# Patient Record
Sex: Female | Born: 1935 | Race: White | Hispanic: No | Smoking: Never smoker
Health system: Southern US, Community
[De-identification: ages and names within clinical notes are randomized; demographics above are authoritative.]

## PROBLEM LIST (undated history)

## (undated) DIAGNOSIS — D649 Anemia, unspecified: Secondary | ICD-10-CM

## (undated) DIAGNOSIS — E039 Hypothyroidism, unspecified: Secondary | ICD-10-CM

## (undated) DIAGNOSIS — E785 Hyperlipidemia, unspecified: Secondary | ICD-10-CM

## (undated) DIAGNOSIS — R143 Flatulence: Secondary | ICD-10-CM

## (undated) DIAGNOSIS — I2510 Atherosclerotic heart disease of native coronary artery without angina pectoris: Secondary | ICD-10-CM

## (undated) DIAGNOSIS — K219 Gastro-esophageal reflux disease without esophagitis: Secondary | ICD-10-CM

## (undated) DIAGNOSIS — E538 Deficiency of other specified B group vitamins: Secondary | ICD-10-CM

## (undated) DIAGNOSIS — K5730 Diverticulosis of large intestine without perforation or abscess without bleeding: Secondary | ICD-10-CM

## (undated) DIAGNOSIS — R142 Eructation: Secondary | ICD-10-CM

## (undated) DIAGNOSIS — K2970 Gastritis, unspecified, without bleeding: Secondary | ICD-10-CM

## (undated) DIAGNOSIS — I10 Essential (primary) hypertension: Secondary | ICD-10-CM

## (undated) DIAGNOSIS — K529 Noninfective gastroenteritis and colitis, unspecified: Secondary | ICD-10-CM

## (undated) DIAGNOSIS — R197 Diarrhea, unspecified: Secondary | ICD-10-CM

## (undated) DIAGNOSIS — I739 Peripheral vascular disease, unspecified: Secondary | ICD-10-CM

## (undated) DIAGNOSIS — K9089 Other intestinal malabsorption: Secondary | ICD-10-CM

## (undated) DIAGNOSIS — I82409 Acute embolism and thrombosis of unspecified deep veins of unspecified lower extremity: Secondary | ICD-10-CM

## (undated) DIAGNOSIS — K589 Irritable bowel syndrome without diarrhea: Secondary | ICD-10-CM

## (undated) DIAGNOSIS — K3184 Gastroparesis: Secondary | ICD-10-CM

## (undated) DIAGNOSIS — R141 Gas pain: Secondary | ICD-10-CM

## (undated) DIAGNOSIS — F0390 Unspecified dementia without behavioral disturbance: Secondary | ICD-10-CM

## (undated) DIAGNOSIS — R1310 Dysphagia, unspecified: Secondary | ICD-10-CM

## (undated) DIAGNOSIS — K2990 Gastroduodenitis, unspecified, without bleeding: Secondary | ICD-10-CM

## (undated) HISTORY — DX: Diarrhea, unspecified: R19.7

## (undated) HISTORY — DX: Gastroduodenitis, unspecified, without bleeding: K29.90

## (undated) HISTORY — DX: Flatulence: R14.3

## (undated) HISTORY — DX: Acute embolism and thrombosis of unspecified deep veins of unspecified lower extremity: I82.409

## (undated) HISTORY — PX: CHOLECYSTECTOMY: SHX55

## (undated) HISTORY — DX: Gas pain: R14.1

## (undated) HISTORY — PX: CORONARY ARTERY BYPASS GRAFT: SHX141

## (undated) HISTORY — PX: UPPER GASTROINTESTINAL ENDOSCOPY: SHX188

## (undated) HISTORY — DX: Gastro-esophageal reflux disease without esophagitis: K21.9

## (undated) HISTORY — DX: Atherosclerotic heart disease of native coronary artery without angina pectoris: I25.10

## (undated) HISTORY — DX: Eructation: R14.2

## (undated) HISTORY — DX: Other intestinal malabsorption: K90.89

## (undated) HISTORY — DX: Diverticulosis of large intestine without perforation or abscess without bleeding: K57.30

## (undated) HISTORY — DX: Gastroparesis: K31.84

## (undated) HISTORY — DX: Anemia, unspecified: D64.9

## (undated) HISTORY — DX: Dysphagia, unspecified: R13.10

## (undated) HISTORY — DX: Essential (primary) hypertension: I10

## (undated) HISTORY — DX: Deficiency of other specified B group vitamins: E53.8

## (undated) HISTORY — DX: Noninfective gastroenteritis and colitis, unspecified: K52.9

## (undated) HISTORY — DX: Irritable bowel syndrome, unspecified: K58.9

## (undated) HISTORY — DX: Hyperlipidemia, unspecified: E78.5

## (undated) HISTORY — DX: Unspecified dementia, unspecified severity, without behavioral disturbance, psychotic disturbance, mood disturbance, and anxiety: F03.90

## (undated) HISTORY — DX: Gastritis, unspecified, without bleeding: K29.70

## (undated) HISTORY — DX: Peripheral vascular disease, unspecified: I73.9

## (undated) HISTORY — DX: Hypothyroidism, unspecified: E03.9

---

## 1998-05-02 ENCOUNTER — Encounter: Payer: Self-pay | Admitting: Neurosurgery

## 1998-05-06 ENCOUNTER — Ambulatory Visit: Admission: RE | Admit: 1998-05-06 | Discharge: 1998-05-06 | Payer: Self-pay | Admitting: Neurosurgery

## 1998-12-01 ENCOUNTER — Other Ambulatory Visit: Admission: RE | Admit: 1998-12-01 | Discharge: 1998-12-01 | Payer: Self-pay | Admitting: Gastroenterology

## 1998-12-01 ENCOUNTER — Encounter (INDEPENDENT_AMBULATORY_CARE_PROVIDER_SITE_OTHER): Payer: Self-pay

## 1999-06-17 ENCOUNTER — Ambulatory Visit (HOSPITAL_COMMUNITY): Admission: RE | Admit: 1999-06-17 | Discharge: 1999-06-17 | Payer: Self-pay | Admitting: Gastroenterology

## 1999-08-16 ENCOUNTER — Ambulatory Visit (HOSPITAL_COMMUNITY): Admission: RE | Admit: 1999-08-16 | Discharge: 1999-08-16 | Payer: Self-pay | Admitting: Neurosurgery

## 1999-08-16 ENCOUNTER — Encounter: Payer: Self-pay | Admitting: Neurosurgery

## 1999-09-08 ENCOUNTER — Ambulatory Visit (HOSPITAL_BASED_OUTPATIENT_CLINIC_OR_DEPARTMENT_OTHER): Admission: RE | Admit: 1999-09-08 | Discharge: 1999-09-08 | Payer: Self-pay | Admitting: Orthopaedic Surgery

## 2001-05-17 ENCOUNTER — Ambulatory Visit (HOSPITAL_COMMUNITY): Admission: RE | Admit: 2001-05-17 | Discharge: 2001-05-17 | Payer: Self-pay | Admitting: Gastroenterology

## 2001-05-17 ENCOUNTER — Encounter (INDEPENDENT_AMBULATORY_CARE_PROVIDER_SITE_OTHER): Payer: Self-pay | Admitting: Specialist

## 2002-05-22 ENCOUNTER — Ambulatory Visit (HOSPITAL_COMMUNITY): Admission: RE | Admit: 2002-05-22 | Discharge: 2002-05-22 | Payer: Self-pay | Admitting: Neurosurgery

## 2002-05-22 ENCOUNTER — Encounter: Payer: Self-pay | Admitting: Neurosurgery

## 2002-06-20 ENCOUNTER — Inpatient Hospital Stay (HOSPITAL_COMMUNITY): Admission: RE | Admit: 2002-06-20 | Discharge: 2002-06-21 | Payer: Self-pay

## 2002-06-20 ENCOUNTER — Encounter (INDEPENDENT_AMBULATORY_CARE_PROVIDER_SITE_OTHER): Payer: Self-pay | Admitting: Specialist

## 2003-07-22 ENCOUNTER — Inpatient Hospital Stay (HOSPITAL_COMMUNITY): Admission: RE | Admit: 2003-07-22 | Discharge: 2003-07-25 | Payer: Self-pay | Admitting: Orthopedic Surgery

## 2003-07-25 ENCOUNTER — Inpatient Hospital Stay (HOSPITAL_COMMUNITY)
Admission: RE | Admit: 2003-07-25 | Discharge: 2003-08-02 | Payer: Self-pay | Admitting: Physical Medicine & Rehabilitation

## 2003-08-29 ENCOUNTER — Ambulatory Visit (HOSPITAL_COMMUNITY): Admission: RE | Admit: 2003-08-29 | Discharge: 2003-08-29 | Payer: Self-pay | Admitting: Orthopedic Surgery

## 2003-10-29 ENCOUNTER — Inpatient Hospital Stay (HOSPITAL_COMMUNITY): Admission: AD | Admit: 2003-10-29 | Discharge: 2003-10-31 | Payer: Self-pay | Admitting: Cardiology

## 2004-02-21 ENCOUNTER — Ambulatory Visit: Payer: Self-pay | Admitting: Gastroenterology

## 2004-02-27 ENCOUNTER — Ambulatory Visit (HOSPITAL_COMMUNITY): Admission: RE | Admit: 2004-02-27 | Discharge: 2004-02-27 | Payer: Self-pay | Admitting: Gastroenterology

## 2004-03-24 ENCOUNTER — Ambulatory Visit: Payer: Self-pay | Admitting: Gastroenterology

## 2004-05-12 ENCOUNTER — Ambulatory Visit: Payer: Self-pay | Admitting: Gastroenterology

## 2004-05-12 ENCOUNTER — Ambulatory Visit (HOSPITAL_COMMUNITY): Admission: RE | Admit: 2004-05-12 | Discharge: 2004-05-12 | Payer: Self-pay | Admitting: Gastroenterology

## 2004-07-09 ENCOUNTER — Ambulatory Visit: Payer: Self-pay | Admitting: Gastroenterology

## 2004-07-18 HISTORY — PX: NISSEN FUNDOPLICATION: SHX2091

## 2004-08-05 ENCOUNTER — Inpatient Hospital Stay (HOSPITAL_COMMUNITY): Admission: RE | Admit: 2004-08-05 | Discharge: 2004-08-10 | Payer: Self-pay | Admitting: General Surgery

## 2004-09-08 ENCOUNTER — Ambulatory Visit: Payer: Self-pay | Admitting: Gastroenterology

## 2004-09-17 ENCOUNTER — Encounter: Admission: RE | Admit: 2004-09-17 | Discharge: 2004-09-17 | Payer: Self-pay | Admitting: General Surgery

## 2004-09-22 ENCOUNTER — Ambulatory Visit: Payer: Self-pay | Admitting: Gastroenterology

## 2004-10-06 ENCOUNTER — Ambulatory Visit: Payer: Self-pay | Admitting: Gastroenterology

## 2004-10-14 ENCOUNTER — Ambulatory Visit: Payer: Self-pay | Admitting: Gastroenterology

## 2004-10-28 ENCOUNTER — Ambulatory Visit: Payer: Self-pay | Admitting: Gastroenterology

## 2004-10-28 ENCOUNTER — Encounter (INDEPENDENT_AMBULATORY_CARE_PROVIDER_SITE_OTHER): Payer: Self-pay | Admitting: *Deleted

## 2004-10-30 ENCOUNTER — Encounter: Admission: RE | Admit: 2004-10-30 | Discharge: 2004-10-30 | Payer: Self-pay | Admitting: General Surgery

## 2004-11-05 ENCOUNTER — Encounter: Admission: RE | Admit: 2004-11-05 | Discharge: 2004-11-05 | Payer: Self-pay | Admitting: General Surgery

## 2006-03-15 ENCOUNTER — Ambulatory Visit: Payer: Self-pay | Admitting: Gastroenterology

## 2006-03-21 ENCOUNTER — Ambulatory Visit: Payer: Self-pay | Admitting: Gastroenterology

## 2006-03-23 ENCOUNTER — Ambulatory Visit: Payer: Self-pay | Admitting: Gastroenterology

## 2006-03-25 ENCOUNTER — Ambulatory Visit (HOSPITAL_COMMUNITY): Admission: RE | Admit: 2006-03-25 | Discharge: 2006-03-25 | Payer: Self-pay | Admitting: Gastroenterology

## 2006-04-05 ENCOUNTER — Ambulatory Visit: Payer: Self-pay | Admitting: Gastroenterology

## 2006-06-30 ENCOUNTER — Ambulatory Visit (HOSPITAL_COMMUNITY): Admission: RE | Admit: 2006-06-30 | Discharge: 2006-07-01 | Payer: Self-pay | Admitting: General Surgery

## 2007-02-08 ENCOUNTER — Ambulatory Visit: Payer: Self-pay | Admitting: Gastroenterology

## 2007-04-06 ENCOUNTER — Encounter: Admission: RE | Admit: 2007-04-06 | Discharge: 2007-04-06 | Payer: Self-pay | Admitting: Neurosurgery

## 2007-06-09 ENCOUNTER — Ambulatory Visit: Payer: Self-pay | Admitting: Gastroenterology

## 2007-06-09 LAB — CONVERTED CEMR LAB
Albumin: 3 g/dL — ABNORMAL LOW (ref 3.5–5.2)
Alkaline Phosphatase: 108 units/L (ref 39–117)
Basophils Absolute: 0 10*3/uL (ref 0.0–0.1)
Basophils Relative: 0.4 % (ref 0.0–1.0)
CO2: 32 meq/L (ref 19–32)
Calcium: 8.7 mg/dL (ref 8.4–10.5)
Chloride: 108 meq/L (ref 96–112)
Creatinine, Ser: 0.7 mg/dL (ref 0.4–1.2)
Eosinophils Absolute: 0.4 10*3/uL (ref 0.0–0.6)
Eosinophils Relative: 8.3 % — ABNORMAL HIGH (ref 0.0–5.0)
Glucose, Bld: 113 mg/dL — ABNORMAL HIGH (ref 70–99)
HCT: 32.7 % — ABNORMAL LOW (ref 36.0–46.0)
Hemoglobin: 10.6 g/dL — ABNORMAL LOW (ref 12.0–15.0)
MCV: 86.3 fL (ref 78.0–100.0)
Neutro Abs: 2.4 10*3/uL (ref 1.4–7.7)
Potassium: 3.8 meq/L (ref 3.5–5.1)
RDW: 14.2 % (ref 11.5–14.6)
Sodium: 145 meq/L (ref 135–145)
Total Bilirubin: 0.8 mg/dL (ref 0.3–1.2)
WBC: 4.5 10*3/uL (ref 4.5–10.5)

## 2007-06-16 ENCOUNTER — Ambulatory Visit: Payer: Self-pay | Admitting: Gastroenterology

## 2007-06-21 ENCOUNTER — Encounter: Payer: Self-pay | Admitting: Gastroenterology

## 2007-07-04 ENCOUNTER — Ambulatory Visit: Payer: Self-pay | Admitting: Gastroenterology

## 2007-07-04 LAB — CONVERTED CEMR LAB
Basophils Absolute: 0 10*3/uL (ref 0.0–0.1)
Eosinophils Absolute: 0.6 10*3/uL (ref 0.0–0.6)
Eosinophils Relative: 10.9 % — ABNORMAL HIGH (ref 0.0–5.0)
Folate: 13.4 ng/mL
HCT: 34.3 % — ABNORMAL LOW (ref 36.0–46.0)
Hemoglobin: 11.1 g/dL — ABNORMAL LOW (ref 12.0–15.0)
Lymphocytes Relative: 31.5 % (ref 12.0–46.0)
MCHC: 32.3 g/dL (ref 30.0–36.0)
MCV: 86 fL (ref 78.0–100.0)
Monocytes Absolute: 0.4 10*3/uL (ref 0.2–0.7)
Saturation Ratios: 12.2 % — ABNORMAL LOW (ref 20.0–50.0)
TSH: 1.01 microintl units/mL (ref 0.35–5.50)
Transferrin: 274.3 mg/dL (ref >=212.0)
Vitamin B-12: 316 pg/mL (ref 211–911)
WBC: 5.1 10*3/uL (ref 4.5–10.5)

## 2007-07-05 ENCOUNTER — Ambulatory Visit (HOSPITAL_COMMUNITY): Admission: RE | Admit: 2007-07-05 | Discharge: 2007-07-05 | Payer: Self-pay | Admitting: Gastroenterology

## 2007-07-10 ENCOUNTER — Encounter: Payer: Self-pay | Admitting: Gastroenterology

## 2007-07-20 ENCOUNTER — Ambulatory Visit: Payer: Self-pay | Admitting: Gastroenterology

## 2007-07-26 ENCOUNTER — Encounter: Payer: Self-pay | Admitting: Gastroenterology

## 2007-07-26 ENCOUNTER — Ambulatory Visit: Payer: Self-pay | Admitting: Gastroenterology

## 2007-08-17 ENCOUNTER — Ambulatory Visit: Payer: Self-pay | Admitting: Gastroenterology

## 2007-08-17 DIAGNOSIS — R197 Diarrhea, unspecified: Secondary | ICD-10-CM | POA: Insufficient documentation

## 2007-09-12 ENCOUNTER — Telehealth: Payer: Self-pay | Admitting: Gastroenterology

## 2007-10-16 DIAGNOSIS — K219 Gastro-esophageal reflux disease without esophagitis: Secondary | ICD-10-CM

## 2007-10-16 DIAGNOSIS — I739 Peripheral vascular disease, unspecified: Secondary | ICD-10-CM

## 2007-10-16 DIAGNOSIS — I10 Essential (primary) hypertension: Secondary | ICD-10-CM | POA: Insufficient documentation

## 2007-10-16 DIAGNOSIS — K5730 Diverticulosis of large intestine without perforation or abscess without bleeding: Secondary | ICD-10-CM | POA: Insufficient documentation

## 2007-10-16 DIAGNOSIS — I2510 Atherosclerotic heart disease of native coronary artery without angina pectoris: Secondary | ICD-10-CM | POA: Insufficient documentation

## 2007-10-16 DIAGNOSIS — E785 Hyperlipidemia, unspecified: Secondary | ICD-10-CM

## 2007-10-16 DIAGNOSIS — I82409 Acute embolism and thrombosis of unspecified deep veins of unspecified lower extremity: Secondary | ICD-10-CM | POA: Insufficient documentation

## 2007-10-16 DIAGNOSIS — E039 Hypothyroidism, unspecified: Secondary | ICD-10-CM | POA: Insufficient documentation

## 2007-10-24 ENCOUNTER — Ambulatory Visit: Payer: Self-pay | Admitting: Gastroenterology

## 2007-12-05 ENCOUNTER — Ambulatory Visit: Payer: Self-pay | Admitting: Gastroenterology

## 2007-12-05 ENCOUNTER — Telehealth: Payer: Self-pay | Admitting: Gastroenterology

## 2007-12-13 ENCOUNTER — Telehealth: Payer: Self-pay | Admitting: Gastroenterology

## 2008-05-09 ENCOUNTER — Ambulatory Visit: Payer: Self-pay | Admitting: Gastroenterology

## 2008-05-09 DIAGNOSIS — R142 Eructation: Secondary | ICD-10-CM

## 2008-05-09 DIAGNOSIS — R1310 Dysphagia, unspecified: Secondary | ICD-10-CM | POA: Insufficient documentation

## 2008-05-09 DIAGNOSIS — R141 Gas pain: Secondary | ICD-10-CM

## 2008-05-09 DIAGNOSIS — R143 Flatulence: Secondary | ICD-10-CM

## 2008-06-05 ENCOUNTER — Encounter: Payer: Self-pay | Admitting: Gastroenterology

## 2008-06-05 ENCOUNTER — Ambulatory Visit: Payer: Self-pay | Admitting: Gastroenterology

## 2008-06-05 DIAGNOSIS — K2990 Gastroduodenitis, unspecified, without bleeding: Secondary | ICD-10-CM

## 2008-06-05 DIAGNOSIS — K2970 Gastritis, unspecified, without bleeding: Secondary | ICD-10-CM | POA: Insufficient documentation

## 2008-06-07 ENCOUNTER — Encounter: Payer: Self-pay | Admitting: Gastroenterology

## 2008-06-24 DIAGNOSIS — K3184 Gastroparesis: Secondary | ICD-10-CM | POA: Insufficient documentation

## 2008-06-25 ENCOUNTER — Ambulatory Visit: Payer: Self-pay | Admitting: Gastroenterology

## 2008-06-25 DIAGNOSIS — K5289 Other specified noninfective gastroenteritis and colitis: Secondary | ICD-10-CM

## 2008-06-25 LAB — CONVERTED CEMR LAB
AST: 25 units/L (ref 0–37)
Albumin: 3.2 g/dL — ABNORMAL LOW (ref 3.5–5.2)
Alkaline Phosphatase: 92 units/L (ref 39–117)
Basophils Absolute: 0 10*3/uL (ref 0.0–0.1)
Basophils Relative: 0.1 % (ref 0.0–3.0)
CO2: 31 meq/L (ref 19–32)
Eosinophils Absolute: 0.2 10*3/uL (ref 0.0–0.7)
Eosinophils Relative: 4.5 % (ref 0.0–5.0)
GFR calc Af Amer: 122 mL/min
GFR calc non Af Amer: 101 mL/min
HCT: 32.6 % — ABNORMAL LOW (ref 36.0–46.0)
Hemoglobin: 10.9 g/dL — ABNORMAL LOW (ref 12.0–15.0)
Iron: 51 ug/dL (ref 42–145)
Neutrophils Relative %: 59.3 % (ref 43.0–77.0)
Platelets: 156 10*3/uL (ref 150–400)
RBC: 3.59 M/uL — ABNORMAL LOW (ref 3.87–5.11)
RDW: 13.6 % (ref 11.5–14.6)
Sed Rate: 36 mm/hr — ABNORMAL HIGH (ref 0–22)
Sodium: 145 meq/L (ref 135–145)
Total Protein: 5.9 g/dL — ABNORMAL LOW (ref 6.0–8.3)
Transferrin: 268.5 mg/dL (ref 212.0–360.0)
Vitamin B-12: 149 pg/mL — ABNORMAL LOW (ref 211–911)

## 2008-06-28 ENCOUNTER — Encounter: Payer: Self-pay | Admitting: Gastroenterology

## 2008-07-01 ENCOUNTER — Ambulatory Visit: Payer: Self-pay | Admitting: Gastroenterology

## 2008-07-05 ENCOUNTER — Telehealth: Payer: Self-pay | Admitting: Gastroenterology

## 2008-07-08 ENCOUNTER — Ambulatory Visit: Payer: Self-pay | Admitting: Gastroenterology

## 2008-07-08 DIAGNOSIS — E538 Deficiency of other specified B group vitamins: Secondary | ICD-10-CM

## 2008-07-15 ENCOUNTER — Ambulatory Visit: Payer: Self-pay | Admitting: Gastroenterology

## 2008-08-12 ENCOUNTER — Ambulatory Visit: Payer: Self-pay | Admitting: Gastroenterology

## 2008-09-10 ENCOUNTER — Ambulatory Visit: Payer: Self-pay | Admitting: Gastroenterology

## 2008-10-10 ENCOUNTER — Ambulatory Visit: Payer: Self-pay | Admitting: Gastroenterology

## 2008-11-15 ENCOUNTER — Ambulatory Visit: Payer: Self-pay | Admitting: Gastroenterology

## 2008-12-11 ENCOUNTER — Ambulatory Visit: Payer: Self-pay | Admitting: Gastroenterology

## 2009-01-24 ENCOUNTER — Ambulatory Visit: Payer: Self-pay | Admitting: Gastroenterology

## 2009-01-24 DIAGNOSIS — K449 Diaphragmatic hernia without obstruction or gangrene: Secondary | ICD-10-CM | POA: Insufficient documentation

## 2009-01-27 ENCOUNTER — Ambulatory Visit (HOSPITAL_COMMUNITY): Admission: RE | Admit: 2009-01-27 | Discharge: 2009-01-27 | Payer: Self-pay | Admitting: Gastroenterology

## 2009-01-28 ENCOUNTER — Encounter: Payer: Self-pay | Admitting: Gastroenterology

## 2009-01-30 ENCOUNTER — Ambulatory Visit: Payer: Self-pay | Admitting: Gastroenterology

## 2009-02-05 ENCOUNTER — Ambulatory Visit: Payer: Self-pay | Admitting: Gastroenterology

## 2009-07-24 ENCOUNTER — Telehealth: Payer: Self-pay | Admitting: Gastroenterology

## 2009-07-28 ENCOUNTER — Encounter: Payer: Self-pay | Admitting: Gastroenterology

## 2009-08-18 ENCOUNTER — Encounter: Payer: Self-pay | Admitting: Gastroenterology

## 2009-09-26 ENCOUNTER — Inpatient Hospital Stay (HOSPITAL_COMMUNITY): Admission: EM | Admit: 2009-09-26 | Discharge: 2009-09-28 | Payer: Self-pay | Admitting: Cardiovascular Disease

## 2009-10-13 ENCOUNTER — Encounter: Payer: Self-pay | Admitting: Gastroenterology

## 2009-10-21 ENCOUNTER — Encounter (INDEPENDENT_AMBULATORY_CARE_PROVIDER_SITE_OTHER): Payer: Self-pay | Admitting: *Deleted

## 2009-10-21 ENCOUNTER — Ambulatory Visit: Payer: Self-pay | Admitting: Gastroenterology

## 2009-10-21 DIAGNOSIS — D649 Anemia, unspecified: Secondary | ICD-10-CM

## 2009-10-21 DIAGNOSIS — R195 Other fecal abnormalities: Secondary | ICD-10-CM

## 2009-10-22 LAB — CONVERTED CEMR LAB
AST: 24 units/L (ref 0–37)
Albumin: 3.9 g/dL (ref 3.5–5.2)
Basophils Relative: 0.7 % (ref 0.0–3.0)
Bilirubin, Direct: 0.1 mg/dL (ref 0.0–0.3)
CO2: 30 meq/L (ref 19–32)
Calcium: 8.9 mg/dL (ref 8.4–10.5)
Creatinine, Ser: 0.7 mg/dL (ref 0.4–1.2)
Ferritin: 25.2 ng/mL (ref 10.0–291.0)
Folate: 7.5 ng/mL
GFR calc non Af Amer: 81.55 mL/min (ref >60)
INR: 0.9 (ref 0.8–1.0)
Lymphocytes Relative: 28.8 % (ref 12.0–46.0)
Lymphs Abs: 1.3 10*3/uL (ref 0.7–4.0)
Monocytes Absolute: 0.3 10*3/uL (ref 0.1–1.0)
Neutrophils Relative %: 53.6 % (ref 43.0–77.0)
Platelets: 183 10*3/uL (ref 150.0–400.0)
Potassium: 4.1 meq/L (ref 3.5–5.1)
Sed Rate: 37 mm/hr — ABNORMAL HIGH (ref 0–22)
Sodium: 144 meq/L (ref 135–145)
Total Protein: 6.7 g/dL (ref 6.0–8.3)
Transferrin: 278.8 mg/dL (ref 212.0–360.0)

## 2009-10-23 ENCOUNTER — Ambulatory Visit: Payer: Self-pay | Admitting: Gastroenterology

## 2009-10-23 ENCOUNTER — Telehealth: Payer: Self-pay | Admitting: Gastroenterology

## 2009-10-28 ENCOUNTER — Ambulatory Visit: Payer: Self-pay | Admitting: Gastroenterology

## 2009-11-13 ENCOUNTER — Encounter (INDEPENDENT_AMBULATORY_CARE_PROVIDER_SITE_OTHER): Payer: Self-pay | Admitting: *Deleted

## 2009-11-13 ENCOUNTER — Ambulatory Visit: Payer: Self-pay | Admitting: Gastroenterology

## 2009-11-25 ENCOUNTER — Ambulatory Visit: Payer: Self-pay | Admitting: Gastroenterology

## 2009-12-01 ENCOUNTER — Encounter: Payer: Self-pay | Admitting: Gastroenterology

## 2009-12-23 ENCOUNTER — Ambulatory Visit: Payer: Self-pay | Admitting: Gastroenterology

## 2010-01-19 ENCOUNTER — Ambulatory Visit: Payer: Self-pay | Admitting: Gastroenterology

## 2010-02-16 ENCOUNTER — Ambulatory Visit: Payer: Self-pay | Admitting: Gastroenterology

## 2010-03-16 ENCOUNTER — Ambulatory Visit: Payer: Self-pay | Admitting: Gastroenterology

## 2010-05-10 ENCOUNTER — Encounter: Payer: Self-pay | Admitting: Gastroenterology

## 2010-05-21 NOTE — Assessment & Plan Note (Signed)
Summary: MONTHLY B12 SHOT...LSW.  Nurse Visit   Vital Signs:  Patient profile:   77 year old female Pulse rate:   72 / minute Pulse rhythm:   regular BP sitting:   190 / 68  (left arm) Cuff size:   regular  Vitals Entered By: Harlow Mares CMA Duncan Dull) (March 16, 2010 11:03 AM) Patient feeling dizzy and she took her meds at 9:30am, I called her cardiologist and spoke to his nurse she states pt is to come straight to their office and see him today. I walked the patient out to the car and explained the situation with her driver.   Allergies: 1)  ! Codeine 2)  ! * Always Use Paper Tape 3)  ! * Latex  Medication Administration  Injection # 1:    Medication: Vit B12 1000 mcg    Diagnosis: B12 DEFICIENCY (ICD-266.2)    Route: IM    Site: L deltoid    Exp Date: 01/2012    Lot #: [id number]    Patient tolerated injection without complications    Given by: Harlow Mares CMA (AAMA) (March 16, 2010 11:06 AM)  Orders Added: 1)  Vit B12 1000 mcg [J3420]

## 2010-05-21 NOTE — Assessment & Plan Note (Signed)
Summary: 3 WEEK F/U..AM.   History of Present Illness Visit Type: Follow-up Visit Primary GI MD: Sheryn Bison MD FACP FAGA Primary Provider: Albertina Senegal, MD  Requesting Provider: n/a Chief Complaint: F/u for gastroparesis, and anemia. Pt states that she is feeling better and denies any GI complaints History of Present Illness:   Her diarrhea has greatly improved off Reglan and with the use of Colestid. Lab data was unremarkable except for B12 deficiency, and we have started replacement therapy.   GI Review of Systems      Denies abdominal pain, acid reflux, belching, bloating, chest pain, dysphagia with liquids, dysphagia with solids, heartburn, loss of appetite, nausea, vomiting, vomiting blood, weight loss, and  weight gain.        Denies anal fissure, black tarry stools, change in bowel habit, constipation, diarrhea, diverticulosis, fecal incontinence, heme positive stool, hemorrhoids, irritable bowel syndrome, jaundice, light color stool, liver problems, rectal bleeding, and  rectal pain.    Current Medications (verified): 1)  Adult Aspirin Ec Low Strength 81 Mg  Tbec (Aspirin) .... One By Mouth Once Daily 2)  Plavix 75 Mg  Tabs (Clopidogrel Bisulfate) .... One By Mouth Once Daily(On Hold) 3)  Toprol Xl 50 Mg  Tb24 (Metoprolol Succinate) .... 1/2  By Mouth Once Daily 4)  Vasotec 20 Mg  Tabs (Enalapril Maleate) .... One By Mouth Once Daily 5)  Amlodipine Besylate 10 Mg Tabs (Amlodipine Besylate) .Marland Kitchen.. 1 By Mouth Once Daily 6)  Synthroid 50 Mcg  Tabs (Levothyroxine Sodium) .... One By Mouth Once Daily 7)  Benefiber   Powd (Wheat Dextrin) .... One Tbsp Every Morning. 8)  Lasix 20 Mg  Tabs (Furosemide) .... One Tablet By Mouth Every Other Day 9)  K-Tabs 10 Meq Cr-Tabs (Potassium Chloride) .... One Tablet By Mouth Once Daily 10)  Lipitor 20 Mg Tabs (Atorvastatin Calcium) .Marland Kitchen.. 1 By Mouth Once Daily 11)  Colestid 1 Gm Tabs (Colestipol Hcl) .... Once Daily 12)  Xanax 0.25 Mg Tabs  (Alprazolam) .Marland Kitchen.. 1 Q Hs As Needed For Sleep 13)  Cyanocobalamin 1000 Mcg/ml Inj Soln (Cyanocobalamin) .Marland Kitchen.. 1 Cc Im Monthly  Allergies (verified): 1)  ! Codeine 2)  ! * Always Use Paper Tape 3)  ! * Latex  Past History:  Past medical, surgical, family and social histories (including risk factors) reviewed for relevance to current acute and chronic problems.  Past Medical History: Reviewed history from 06/24/2008 and no changes required. Current Problems:  GASTROPARESIS (ICD-536.3) GASTRITIS (ICD-535.50) FLATULENCE ERUCTATION AND GAS PAIN (ICD-787.3) DYSPHAGIA UNSPECIFIED (ICD-787.20) PERIPHERAL VASCULAR DISEASE (ICD-443.9) HYPERLIPIDEMIA (ICD-272.4) GERD (ICD-530.81) HYPOTHYROIDISM (ICD-244.9) CAD (ICD-414.00) HYPERTENSION (ICD-401.9) DEEP VENOUS THROMBOPHLEBITIS (ICD-453.40) DIVERTICULOSIS, COLON (ICD-562.10) DIARRHEA (ICD-787.91)  Past Surgical History: Reviewed history from 05/09/2008 and no changes required. NIssan Fundoplication 07-2004 Cholecystectomy Hernia repair  Family History: Reviewed history from 12/05/2007 and no changes required. No FH of Colon Cancer: Family History of Heart Disease: mother and uncle  Social History: Reviewed history from 12/05/2007 and no changes required. Occupation: retired Patient is a former smoker.  Alcohol Use - no Illicit Drug Use - no Patient does not get regular exercise.  sometimes walks her dog  Review of Systems       The patient complains of swelling of feet/legs.  The patient denies allergy/sinus, anemia, anxiety-new, arthritis/joint pain, back pain, blood in urine, breast changes/lumps, change in vision, confusion, cough, coughing up blood, depression-new, fainting, fatigue, fever, headaches-new, hearing problems, heart murmur, heart rhythm changes, itching, menstrual pain, muscle pains/cramps, night sweats, nosebleeds,  pregnancy symptoms, shortness of breath, skin rash, sleeping problems, sore throat, swollen lymph  glands, thirst - excessive , urination - excessive , urination changes/pain, urine leakage, vision changes, and voice change.         chronic dizziness related to her peripheral vascular disease. She is on a multitude of cardiovascular medications and daily Plavix and is followed by Dr. Susa Griffins.  Vital Signs:  Patient profile:   77 year old female Height:      61 inches Weight:      134 pounds BMI:     25.41 BSA:     1.59 Pulse rate:   76 / minute Pulse rhythm:   regular BP sitting:   136 / 80  (left arm) Cuff size:   regular  Vitals Entered By: Ok Anis CMA (November 13, 2009 10:35 AM)  Physical Exam  General:  Well developed, well nourished, no acute distress.healthy appearing.  healthy appearing.   Head:  Normocephalic and atraumatic. Eyes:  PERRLA, no icterus.exam deferred to patient's ophthalmologist.  exam deferred to patient's ophthalmologist.   Psych:  Alert and cooperative. Normal mood and affect.   Impression & Recommendations:  Problem # 1:  FECAL OCCULT BLOOD (ICD-792.1) Assessment Improved CBC was normal and stool cards for heme and hemoglobin were negative. Her anemia is related to B12 deficiency, and we have begun parenteral replacement therapy. I do not think she needs repeat colonoscopy at this time.  Problem # 2:  B12 DEFICIENCY (ICD-266.2) Assessment: Improved  Problem # 3:  DIARRHEA (ICD-787.91) Assessment: Improved Avoidance of Reglan and we'll increase Colestid to 2 g a day as tolerated. Stool exam for fecal elastase-1 has been ordered.  Problem # 4:  PERIPHERAL VASCULAR DISEASE (ICD-443.9) Assessment: Unchanged blood pressure today is 136/80 and pulse is 76 and regular. She is to continue all of her other medications per Dr. Alanda Amass and Dr. Albertina Senegal  Patient Instructions: 1)  Increase colestd to two tabs mid morning. 2)  Please go to the basement for lab instructions. 3)  The medication list was reviewed and reconciled.  All  changed / newly prescribed medications were explained.  A complete medication list was provided to the patient / caregiver. 4)  Please continue current medications.  5)  Copy sent to : Dr. Susa Griffins and Dr. Albertina Senegal.  Appended Document: 3 WEEK F/U..AM.    Clinical Lists Changes  Medications: Changed medication from COLESTID 1 GM TABS (COLESTIPOL HCL) once daily to COLESTID 1 GM TABS (COLESTIPOL HCL) 2 tabs mid morning - Signed Rx of COLESTID 1 GM TABS (COLESTIPOL HCL) 2 tabs mid morning;  #60 x 6;  Signed;  Entered by: Ashok Cordia RN;  Authorized by: Mardella Layman MD [facility];  Method used: Electronically to [facility] [address], [place], [place], [place]  [id number], Ph: [phone], Fax: [phone]    Prescriptions: COLESTID 1 GM TABS (COLESTIPOL HCL) 2 tabs mid morning  #60 x 6   Entered by:   Ashok Cordia RN   Authorized by:   Mardella Layman MD [facility]   Signed by:   Ashok Cordia RN on 11/13/2009   Method used:   Electronically to        [facility] (retail)       [address]       [place], [place]  [address]       Ph: [phone]  Fax: [phone]   RxID:   [id number]

## 2010-05-21 NOTE — Assessment & Plan Note (Signed)
Summary: MONTHLY B12 INJECTION/266.2//SP  Nurse Visit   Allergies: 1)  ! Codeine 2)  ! * Always Use Paper Tape 3)  ! * Latex  Medication Administration  Injection # 1:    Medication: Vit B12 1000 mcg    Diagnosis: B12 DEFICIENCY (ICD-266.2)    Route: IM    Site: R deltoid    Exp Date: 10/2011    Lot #: [id number]    Mfr: [employer]    Comments: pt to schedule next monthly b12 at front desk    Patient tolerated injection without complications    Given by: Chales Abrahams CMA Duncan Dull) (January 19, 2010 10:38 AM)  Orders Added: 1)  Vit B12 1000 mcg [J3420]

## 2010-05-21 NOTE — Letter (Signed)
Summary: [facility]   Imported By: Sherian Rein 08/26/2009 09:22:56  _____________________________________________________________________  External Attachment:    Type:   Image     Comment:   External Document

## 2010-05-21 NOTE — Progress Notes (Signed)
Summary: ? re allergic reaction  Phone Note Call from Patient Call back at Home Phone [phone]   Caller: Patient Call For: Jarold Motto Reason for Call: Talk to Nurse Summary of Call: Patient had an MRI last year and was allergic to something wants to know the name. Initial call taken by: Tawni Levy,  July 24, 2009 1:20 PM  Follow-up for Phone Call        Pt states she had some type of reaction to a dye that she was given at [facility] for some type of xray last year.  She asks what she can do to find out what she was given.  Pt instructed to call [facility] and talk to someone in radiology. Follow-up by: Ashok Cordia RN,  July 24, 2009 2:26 PM

## 2010-05-21 NOTE — Assessment & Plan Note (Signed)
Summary: Monthly B12/dfs  Nurse Visit   Allergies: 1)  ! Codeine 2)  ! * Always Use Paper Tape 3)  ! * Latex  Medication Administration  Injection # 1:    Medication: Vit B12 1000 mcg    Diagnosis: B12 DEFICIENCY (ICD-266.2)    Route: IM    Exp Date: 2/13    Lot #: [id number]    Mfr: [employer]    Patient tolerated injection without complications    Given by: Lamona Curl CMA (AAMA) (October 28, 2009 10:55 AM)  Orders Added: 1)  Vit B12 1000 mcg [J3420]

## 2010-05-21 NOTE — Letter (Signed)
Summary: [name]: Immunoassay Fecal Occult Blood (iFOB) Order [facility]  [address] [place], [place] [address]   Phone: [phone]  Fax: [phone]      [facility]: Immunoassay Fecal Occult Blood (iFOB) Order Form   November 13, 2009 MRN: [id number]   MELI FALEY [birth year]   Physicican Name: Jarold Motto  Diagnosis Code: 787.91      Ashok Cordia RN

## 2010-05-21 NOTE — Progress Notes (Signed)
Summary: apeak to nurse  Phone Note Call from Patient Call back at Home Phone [phone]   Caller: Patient Call For: Jarold Motto Reason for Call: Talk to Nurse Summary of Call: Patient would like to speak to nurse regarding her meds Initial call taken by: Tawni Levy,  October 23, 2009 3:16 PM  Follow-up for Phone Call        Pt has questions re colestid.  Answered all of pt's questions. Follow-up by: Ashok Cordia RN,  October 23, 2009 3:23 PM
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
## 2010-05-21 NOTE — Assessment & Plan Note (Signed)
Summary: MONTHLY B12 SHOT...LSW.  Nurse Visit   Medication Administration  Injection # 1:    Medication: Vit B12 1000 mcg    Diagnosis: B12 DEFICIENCY (ICD-266.2)    Route: IM    Site: R deltoid    Exp Date: 07/2011    Lot #: [id number]    Mfr: [employer]    Comments: PT WILL RETURN ON 12/23/09 FOR NEXT INJECTION    Patient tolerated injection without complications    Given by: Francee Piccolo CMA Duncan Dull) (November 25, 2009 10:54 AM)  Orders Added: 1)  Vit B12 1000 mcg [J3420]

## 2010-05-21 NOTE — Assessment & Plan Note (Signed)
Summary: MONTHLY B12/266.2//SP  Nurse Visit   Medication Administration  Injection # 1:    Medication: Vit B12 1000 mcg    Diagnosis: B12 DEFICIENCY (ICD-266.2)    Route: IM    Site: L deltoid    Exp Date: 09/2011    Lot #: [id number]    Mfr: [employer]    Comments: PT WILL RETURN ON 10/3 FOR NEXT INJECTION    Patient tolerated injection without complications    Given by: Francee Piccolo CMA Duncan Dull) (December 23, 2009 10:38 AM)  Orders Added: 1)  Vit B12 1000 mcg [J3420]

## 2010-05-21 NOTE — Assessment & Plan Note (Signed)
Summary: MONTHLY B12 SHOT & FLU SHOT...LSW.  Nurse Visit   Allergies: 1)  ! Codeine 2)  ! * Always Use Paper Tape 3)  ! * Latex  Medication Administration  Injection # 1:    Medication: Vit B12 1000 mcg    Diagnosis: B12 DEFICIENCY (ICD-266.2)    Route: IM    Site: L deltoid    Exp Date: 10/2011    Lot #: [id number]    Mfr: American Regent    Patient tolerated injection without complications    Given by: Harlow Mares CMA (AAMA) (February 16, 2010 10:57 AM)  Orders Added: 1)  Vit B12 1000 mcg [J3420]

## 2010-05-21 NOTE — Letter (Signed)
Summary: [facility]: Immunoassay Fecal Occult Blood (iFOB) Order [facility]  [address] [place], [place] [address]   Phone: [phone]  Fax: [phone]      [facility]: Immunoassay Fecal Occult Blood (iFOB) Order Form   October 21, 2009 MRN: [id number]   [name] [birth year]   Physicican Name: Jarold Motto  Diagnosis Code: 792.1      Ashok Cordia RN

## 2010-05-21 NOTE — Letter (Signed)
Summary: [facility]  [facility]   Imported By: Lennie Odor 10/27/2009 11:47:37  _____________________________________________________________________  External Attachment:    Type:   Image     Comment:   External Document

## 2010-06-17 ENCOUNTER — Telehealth: Payer: Self-pay | Admitting: Gastroenterology

## 2010-06-25 NOTE — Progress Notes (Signed)
Summary: triage  Phone Note Call from Patient Call back at Home Phone [phone]   Caller: Patient Call For: Dr Jarold Motto Reason for Call: Talk to Nurse Summary of Call: Patient would like to see Dr Jarold Motto for diarrhea offered her 1st available 3-9 and she wants something sooner. (please let the phone ring for a while because she's hard of hearing) Initial call taken by: Tawni Levy,  June 17, 2010 12:33 PM  Follow-up for Phone Call        Patient reports diarrhea x 3 days; sometimes up to 8 x in 1 hour. Patient states she can usually take an Imodium and the diarrhea will clear. She denies pain with the diarrhea and she is on no meds; last note listed Colestid 2G daily. She saw Dr Alanda Amass and told him she feels washed-out and he recommeded she begin B12 injections again. Patient would like to talk to Dr Jarold Motto about restarting them. She also asked about her COLON recall- last one 07/27/2007, but no recall in report or on Flowsheet- sent for chart. The only appointment I could offer patient was 06/26/10 and she took it. She will call if diarrhea gets worse or other problems arise. Follow-up by: Graciella Freer RN,  June 17, 2010 2:16 PM  Additional Follow-up for Phone Call Additional follow up Details #1::        Reviewed patient's chart on EMR. ON last visit, 11/13/09, Dr Jarold Motto stated he did not think patient needed a repeat COLON. Additional Follow-up by: Graciella Freer RN,  June 17, 2010 3:24 PM

## 2010-06-26 ENCOUNTER — Encounter: Payer: Self-pay | Admitting: Gastroenterology

## 2010-06-26 ENCOUNTER — Ambulatory Visit (INDEPENDENT_AMBULATORY_CARE_PROVIDER_SITE_OTHER): Payer: Medicare Other | Admitting: Gastroenterology

## 2010-06-26 DIAGNOSIS — R197 Diarrhea, unspecified: Secondary | ICD-10-CM

## 2010-06-30 NOTE — Assessment & Plan Note (Signed)
Summary: Follow up diarrhea   History of Present Illness Visit Type: Follow-up Visit Primary GI MD: Sheryn Bison MD Faith Rogue Primary Provider: Shearon Stalls Requesting Provider: n/a Chief Complaint: Follow up for IBS, wants to discuss B12 shots History of Present Illness:    this elderly lady with multiple cardiovascular issues continues with frequent stooling but no significant abdominal pain or systemic complaints. she is confused and has advancing dementia not really sure which medications she is taking at this time. recent stool exam for fecal-elastase-1  were negative. I have discontinued her metoclopramide. she as an appointment at [facility] for evaluation of her macular degeneration and also associated severe hearing loss. she is followed by cardiology and Dr. Susa Griffins.   GI Review of Systems      Denies abdominal pain, acid reflux, belching, bloating, chest pain, dysphagia with liquids, dysphagia with solids, heartburn, loss of appetite, nausea, vomiting, vomiting blood, weight loss, and  weight gain.      Reports diarrhea and  irritable bowel syndrome.     Denies anal fissure, black tarry stools, change in bowel habit, constipation, diverticulosis, fecal incontinence, heme positive stool, hemorrhoids, jaundice, light color stool, liver problems, rectal bleeding, and  rectal pain.    Current Medications (verified): 1)  Adult Aspirin Ec Low Strength 81 Mg  Tbec (Aspirin) .... One By Mouth Once Daily 2)  Toprol Xl 50 Mg  Tb24 (Metoprolol Succinate) .... 1/2  By Mouth Once Daily 3)  Synthroid 50 Mcg  Tabs (Levothyroxine Sodium) .... One By Mouth Once Daily 4)  Lasix 20 Mg  Tabs (Furosemide) .... One Tablet By Mouth Every Other Day 5)  K-Tabs 10 Meq Cr-Tabs (Potassium Chloride) .... One Tablet By Mouth Once Daily 6)  Lipitor 20 Mg Tabs (Atorvastatin Calcium) .Marland Kitchen.. 1 By Mouth Once Daily 7)  Colestid 1 Gm Tabs (Colestipol Hcl) .... 2 Tabs Mid Morning 8)  Xanax  0.25 Mg Tabs (Alprazolam) .Marland Kitchen.. 1 Q Hs As Needed For Sleep 9)  Cyanocobalamin 1000 Mcg/ml Inj Soln (Cyanocobalamin) .Marland Kitchen.. 1 Cc Im Monthly 10)  Norvasc 10 Mg Tabs (Amlodipine Besylate) .Marland Kitchen.. 1 By Mouth Once Daily 11)  Tekturna 150 Mg Tabs (Aliskiren Fumarate) .Marland Kitchen.. 1 By Mouth Once Daily 12)  Antivert 25 Mg Tabs (Meclizine Hcl) .... Use As Needed 13)  Tramadol Hcl 50 Mg Tabs (Tramadol Hcl) .... As Needed  Allergies (verified): 1)  ! Codeine 2)  ! * Always Use Paper Tape 3)  ! * Latex  Past History:  Family History: Last updated: 12/05/2007 No FH of Colon Cancer: Family History of Heart Disease: mother and uncle  Social History: Last updated: 12/05/2007 Occupation: retired Patient is a former smoker.  Alcohol Use - no Illicit Drug Use - no Patient does not get regular exercise.  sometimes walks her dog  Past medical, surgical, family and social histories (including risk factors) reviewed for relevance to current acute and chronic problems.  Past Medical History: Reviewed history from 06/24/2008 and no changes required. Current Problems:  GASTROPARESIS (ICD-536.3) GASTRITIS (ICD-535.50) FLATULENCE ERUCTATION AND GAS PAIN (ICD-787.3) DYSPHAGIA UNSPECIFIED (ICD-787.20) PERIPHERAL VASCULAR DISEASE (ICD-443.9) HYPERLIPIDEMIA (ICD-272.4) GERD (ICD-530.81) HYPOTHYROIDISM (ICD-244.9) CAD (ICD-414.00) HYPERTENSION (ICD-401.9) DEEP VENOUS THROMBOPHLEBITIS (ICD-453.40) DIVERTICULOSIS, COLON (ICD-562.10) DIARRHEA (ICD-787.91)  Past Surgical History: Reviewed history from 05/09/2008 and no changes required. NIssan Fundoplication 07-2004 Cholecystectomy Hernia repair  Family History: Reviewed history from 12/05/2007 and no changes required. No FH of Colon Cancer: Family History of Heart Disease: mother and uncle  Social History: Reviewed history  from 12/05/2007 and no changes required. Occupation: retired Patient is a former smoker.  Alcohol Use - no Illicit Drug Use -  no Patient does not get regular exercise.  sometimes walks her dog  Vital Signs:  Patient profile:   77 year old female Height:      61 inches Weight:      135 pounds BMI:     25.60 BSA:     1.60 Pulse rate:   80 / minute Pulse rhythm:   regular BP sitting:   152 / 62  (left arm)  Vitals Entered By: Merri Ray CMA Duncan Dull) (June 26, 2010 8:50 AM)  Physical Exam  General:  Well developed, well nourished, no acute distress. this patient is confused and is very hard of hearing making exam difficult. Head:  Normocephalic and atraumatic. Eyes:  PERRLA, no icterus.exam deferred to patient's ophthalmologist.   Psych:  poor concentration, poor memory, and confused.     Impression & Recommendations:  Problem # 1:  FECAL OCCULT BLOOD (ICD-792.1) Assessment Improved  stool cards 4 human hemoglobin were negative.  Problem # 2:  DIARRHEA (ICD-787.91) Assessment: Unchanged  she did not get her Colestid prescription filled. I have read prescribed Colestid 2 g a day we will see how she does symptomatically. If she does not improve we will try Xifaxan 550 mg twice a day. I have added Align  probiotic therapy to her regime. other considerations would be that she has microscopic colitis , and we may need to do flexible sigmoidoscopy with random biopsies. we'll send this note also to her primary care physician her her advance in dementia problems.  Patient Instructions: 1)  Copy sent to : Delton See Pollock,MD  and Dr. Susa Griffins. 2)  Your prescription(s) have been sent to you pharmacy.  3)  Take Align once a day.  4)  The medication list was reviewed and reconciled.  All changed / newly prescribed medications were explained.  A complete medication list was provided to the patient / caregiver. 5)  Please call our GI Office back in 2 weeks with a follow-up of symptoms. Prescriptions: COLESTID 1 GM TABS (COLESTIPOL HCL) 2 tabs mid morning  #60 x 6   Entered by:   Harlow Mares CMA (AAMA)    Authorized by:   Mardella Layman MD [facility]   Signed by:   Harlow Mares CMA (AAMA) on 06/26/2010   Method used:   Electronically to        [facility] (retail)       [address]       [place], [place]  [id number]       Ph: [phone]       Fax: [phone]   RxID:   [id number]

## 2010-07-06 LAB — BASIC METABOLIC PANEL
BUN: 6 mg/dL (ref 6–23)
BUN: 8 mg/dL (ref 6–23)
CO2: 27 mEq/L (ref 19–32)
CO2: 28 mEq/L (ref 19–32)
CO2: 29 mEq/L (ref 19–32)
Calcium: 8.1 mg/dL — ABNORMAL LOW (ref 8.4–10.5)
Calcium: 8.5 mg/dL (ref 8.4–10.5)
Chloride: 105 mEq/L (ref 96–112)
Chloride: 107 mEq/L (ref 96–112)
Chloride: 108 mEq/L (ref 96–112)
Creatinine, Ser: 0.64 mg/dL (ref 0.4–1.2)
GFR calc Af Amer: >60 mL/min (ref >60)
GFR calc Af Amer: >60 mL/min (ref >60)
Glucose, Bld: 104 mg/dL — ABNORMAL HIGH (ref 70–99)
Glucose, Bld: 143 mg/dL — ABNORMAL HIGH (ref 70–99)
Potassium: 3.6 mEq/L (ref 3.5–5.1)
Sodium: 140 mEq/L (ref 135–145)
Sodium: 141 mEq/L (ref 135–145)

## 2010-07-06 LAB — MAGNESIUM: Magnesium: 1.9 mg/dL (ref 1.5–2.5)

## 2010-07-06 LAB — CK TOTAL AND CKMB (NOT AT ARMC)
CK, MB: 3.2 ng/mL (ref 0.3–4.0)
CK, MB: 3.5 ng/mL (ref 0.3–4.0)
CK, MB: 4 ng/mL (ref 0.3–4.0)
Total CK: 328 U/L — ABNORMAL HIGH (ref 7–177)
Total CK: 442 U/L — ABNORMAL HIGH (ref 7–177)

## 2010-07-06 LAB — CBC
HCT: 29.5 % — ABNORMAL LOW (ref 36.0–46.0)
Hemoglobin: 10.1 g/dL — ABNORMAL LOW (ref 12.0–15.0)
Hemoglobin: 9.9 g/dL — ABNORMAL LOW (ref 12.0–15.0)
MCHC: 33.5 g/dL (ref 30.0–36.0)
MCHC: 34 g/dL (ref 30.0–36.0)
MCV: 90.5 fL (ref 78.0–100.0)
MCV: 90.8 fL (ref 78.0–100.0)
Platelets: 177 10*3/uL (ref 150–400)
RBC: 3.29 MIL/uL — ABNORMAL LOW (ref 3.87–5.11)
RDW: 14.4 % (ref 11.5–15.5)
WBC: 4.9 10*3/uL (ref 4.0–10.5)

## 2010-07-06 LAB — HEMOCCULT GUIAC POC 1CARD (OFFICE): Fecal Occult Bld: NEGATIVE

## 2010-07-06 LAB — TROPONIN I
Troponin I: 0.01 ng/mL (ref 0.00–0.06)
Troponin I: <0.01 ng/mL (ref 0.00–0.06)

## 2010-07-06 LAB — URINE CULTURE

## 2010-08-11 ENCOUNTER — Telehealth: Payer: Self-pay | Admitting: Gastroenterology

## 2010-08-11 NOTE — Telephone Encounter (Signed)
LMOM for pt to call back.

## 2010-08-12 ENCOUNTER — Encounter: Payer: Self-pay | Admitting: *Deleted

## 2010-08-12 NOTE — Telephone Encounter (Signed)
Pt with hx of GERD, Gastoparesis, Chronic Anemia with B12 defieciency, Fundoplication and DEMENTIA..... Last OV 06/26/10 f/u diarrhea and pt wanted to resume B12 injections. According to records, pt never completes the full year of B12 injections. OV previously 11/13/09, she completed 5 months. Last B12 lab 10/21/09 reading 240. Today, pt stated she's having trouble with arthritis and feeling weak. Pt will be given a f/u visit, B12's ordered with close attention to her appointments.

## 2010-08-12 NOTE — Telephone Encounter (Signed)
Notified [name] I am sending her a letter with her appt with Dr Jarold Motto on 08/28/10 at 0945am. [name] stated understanding.

## 2010-08-28 ENCOUNTER — Ambulatory Visit (INDEPENDENT_AMBULATORY_CARE_PROVIDER_SITE_OTHER): Payer: Medicare Other | Admitting: Gastroenterology

## 2010-08-28 ENCOUNTER — Encounter: Payer: Self-pay | Admitting: Gastroenterology

## 2010-08-28 VITALS — BP 136/72 | HR 66 | Ht 61.0 in | Wt 137.0 lb

## 2010-08-28 DIAGNOSIS — K589 Irritable bowel syndrome without diarrhea: Secondary | ICD-10-CM

## 2010-08-28 DIAGNOSIS — F0390 Unspecified dementia without behavioral disturbance: Secondary | ICD-10-CM | POA: Insufficient documentation

## 2010-08-28 DIAGNOSIS — E538 Deficiency of other specified B group vitamins: Secondary | ICD-10-CM | POA: Insufficient documentation

## 2010-08-28 DIAGNOSIS — K9089 Other intestinal malabsorption: Secondary | ICD-10-CM

## 2010-08-28 MED ORDER — LACTASE 3000 UNITS PO TABS: 1.0000 | ORAL_TABLET | Freq: Three times a day (TID) | ORAL | Status: DC

## 2010-08-28 MED ORDER — CYANOCOBALAMIN 1000 MCG/ML IJ SOLN
1000.0000 ug | Freq: Once | INTRAMUSCULAR | Status: AC
  Administered 2010-08-28: 1000 ug via INTRAMUSCULAR

## 2010-08-28 NOTE — Patient Instructions (Signed)
Today you got a b12 injection you will not need any more injections. Dr Jarold Motto will send this note to your primary care MD. Take Lactaid tablets with meals, you can buy this medication over the counter.

## 2010-08-28 NOTE — Progress Notes (Signed)
History of Present Illness: This is a 77 year old Caucasian female with bile - salt diarrhea doing well on Colestid 2 g a day. She also has mild chronic dementia and B12 deficiency. She apparently had recent neurologic consultation in [place], [place]. These records are not available for review. She seemed to be doing well and denies significant gastrointestinal complaints except for postprandial nausea related to her numerous medications.   Current Medications, Allergies, Past Medical History, Past Surgical History, Family History and Social History were reviewed in [employer] record.   Assessment and plan: B12 1000 mcg IM today with monthly B12 shots with her primary care physician. We have had extreme difficulty communicating with this patient because of her dementia . She does not want to do is we'll B12 spray. She seemed to have an element of lactose intolerance and I have prescribed when necessary Lactaid tablets with avoidance of sorbitol and fructose and continuing Colestid as tolerated. See her on when necessary basis as needed.   Please copy her primary care physician, referring physician, and pertinent subspecialists.  Encounter Diagnosis  Name Primary?  . Vitamin B12 deficiency Yes

## 2010-09-01 NOTE — Assessment & Plan Note (Signed)
[name]                         GASTROENTEROLOGY OFFICE NOTE   NAME:BOLINGDaysha, [name]                      MRN:          [id number]  DATE:06/09/2007                            DOB:          July 15, 1921    [name] is an elderly white female, who has been a chronic patient of  mine for many years with underlying irritable bowel syndrome and  suspected bacterial overgrowth of her bowel.  She has had multiple  repairs of both hiatal hernia and diaphragmatic hernia by Dr. Derrell Lolling and  also had repair of an inguinal hernia by Dr. Derrell Lolling in March of 2008.   She now complains of epigastric discomfort, lower abdominal discomfort,  nausea, bloating, mustard-colored stools and five to six loose bowel  movements a day.  It is of note that the patient has been treated for  urinary tract infection, which seems to have exacerbated her problems.  She denies bloody diarrhea, fever, chills, anorexia, weight-loss, or  other systemic complaints.   She is on multiple medications, listed and reviewed in her chart, which  include:   1. Lipitor 20 mg a day.  2. Toprol XL 50 mg a day.  3. Aspirin 81 mg a day.  4. Nexium 40 mg twice a day.  5. Plavix 75 mg a day.  6. Caduet 10/80 mg a day.  7. Zetia 10 mg a day.  8. Questran p.r.n. use.   The patient's last endoscopy was in October this past year and she had  intact fundal plication and was dilated empirically with a #60 [place]  dilator.  Her last colonoscopy exam, because of chronic diarrhea and  stated family history of colon polyps, was entirely normal in July of  2006.  She had multiple colon biopsies, excluding microscopic  collagenous colitis.  At that time, she was placed on cholestyramine  therapy because of her chronic diarrhea.   EXAM:  She is awake and alert, in no acute distress, appearing her  stated age.  She really does not appear any different than previous  exams.  She weighs 142 pounds, which is  about her normal weight.  She was  afebrile and blood pressure was 152/64 and pulse was 72 and regular.  I could not appreciate stigmata of chronic liver disease.  Her chest was clear and she was in a regular rhythm without murmurs,  gallops or rubs.  Her abdominal exam showed no distention, organomegaly, masses or  tenderness.  Bowel sounds were nonobstructive.  Inspection of rectum was unremarkable, as was rectal exam, with formed  stool, which was guaiac-negative.   ASSESSMENT:  Koraline has chronic diarrhea-predominant irritable bowel  syndrome and has had multiple surgical procedures.  I suspect she has a  GI motility disorder, may well have bacterial overgrowth syndrome at  this time.   RECOMMENDATIONS:  I have gone ahead and placed Lakresha on Xifaxan 400 mg  t.i.d. for ten days, along with daily Align therapy.  We will continue  all her other medications and see how she does symptomatically.  Further  workup depending on clinical course and clinical response.  There has  been some suggestion in the past that the patient has dumping  syndrome, related to her multiple surgeries, and also rapid intestinal  transit and has apparently, on reviewing her chart, benefitted from bile  salt resins.  Her symptoms at this time do not seem consistent with  Clostridium difficile and she has been checked on numerous occasions for  this problem.     Vania Rea. Jarold Motto, MD, Caleen Essex, FAGA  Electronically Signed    DRP/MedQ  DD: 06/09/2007  DT: 06/09/2007  Job #: [id number]   cc:   Gerlene Burdock A. Alanda Amass, M.D.

## 2010-09-01 NOTE — Assessment & Plan Note (Signed)
[facility]                         GASTROENTEROLOGY OFFICE NOTE   NAME:BOLINGSitlaly, [name]                      MRN:          [id number]  DATE:07/04/2007                            DOB:          01-27-1922    DATE OF VISIT:  July 04, 2007.   [name] is no better after empiric treatment for possible C.difficile or  bacterial overgrowth syndrome with Xifaxan 400 mg t.i.d.  She continued  with the gas, bloating, vague abdominal pain, rather severe diarrhea.  Her tissue transglutaminase was negative, and she does not have celiac  disease.  Serum carotene was extremely low at 10, and her sed rate was  markedly high at 85.  CBC showed a mild normochromic, normocytic anemia  with a hemoglobin of 10.6.  Metabolic profile was otherwise normal  except for an albumin of 3.0 g%.  TSH was normal.   As from our previous notes, [name] continues with rather severe  diarrhea, abdominal gas, bloating, and weight loss.  She does have  coronary artery disease and actually had CT scan of the abdomen and  chest in July of 2006, which showed evidence of previous diaphragmatic  hernia repair as from our previous notes.  She is also status post  cholecystectomy.  Her symptomatology at this time really does not sound  like mesenteric ischemia.  I am concerned with her extremely low  carotene that she has a chronic malabsorption-mild digestion problem.  I  have worked her up thoroughly otherwise except for possible chronic  pancreatic insufficiency.  As from our previous notes, this has been a  long, ongoing problem, and she has had multiple exams including  colonoscopy with colon biopsies in 2006.   RECOMMENDATIONS:  1. Check MRI exam of pancreas and mesenteric arteries.  2. Check a C19-9 pancreatic tumor marker.  3. Repeat lab data including repeat sed rate.  4. Stool fat exams.  5. Trial of Ultrase-MT 20 one p.o. t.i.d.  6. Consider repeat colonoscopy and repeat colon  biopsies.  7. Consider referral to [facility] for another GI opinion.   ADDENDUM:  The patient is status post cholecystectomy and some of her  diarrhea in the past was felt secondary to bile-salt enteropathy.  However, trials of Questran therapy have not improved any of her  symptoms in the past.  I have asked Season to continue her other  multiple medications until a workup is completed and this does include  aspirin 81 mg twice a day and Plavix 75 mg a day.  She also is on  magnesium and potassium replacement, Synthroid 50 mcg a day, Caduet 20  mg a day, Vasotec 20 mg a day, and Toprol-XL 50 mg a day.    Vania Rea. Jarold Motto, MD, Caleen Essex, FAGA  Electronically Signed   DRP/MedQ  DD: 07/04/2007  DT: 07/04/2007  Job #: [id number]   cc:   Gerlene Burdock A. Alanda Amass, M.D.

## 2010-09-04 NOTE — Discharge Summary (Signed)
NAMESTEPHAN, [name]                       ACCOUNT NO.:  [id number]   MEDICAL RECORD NO.:  [id number]                   PATIENT TYPE:  INP   LOCATION:                                       FACILITY:  [facility]   PHYSICIAN:  Robert A. Thurston Hole, M.D.              DATE OF BIRTH:  Aug 11, 1921   DATE OF ADMISSION:  07/22/2003  DATE OF DISCHARGE:  07/25/2003                                 DISCHARGE SUMMARY   ADMISSION DIAGNOSES:  1. End-stage degenerative joint disease, left knee.  2. Coronary artery disease.  3. Peripheral vascular disease.  4. Hypertension.  5. Hyperlipidemia.   DISCHARGE DIAGNOSES:  1. End-stage degenerative joint disease, left knee.  2. Coronary artery disease.  3. Peripheral vascular disease.  4. Hypertension.  5. Hyperlipidemia.  6. Postoperative blood loss anemia.  7. Chronic obstructive pulmonary disease.   HISTORY OF PRESENT ILLNESS:  The patient is an 77 year old white female who  has end-stage DJD of her left knee.  She has tried conservative care  including anti-inflammatories, cortisone injections, all without success.  She has pain at night, pain with rest, pain unrelieved by conservative care.  She understands risks, benefits, and possible complications of a left total  knee replacement and is without questions.   PROCEDURES:  Procedure in House:  The patient underwent a left total knee  replacement by Dr. Thurston Hole on July 22, 2003, and a femoral nerve block by  anesthesia on July 22, 2003.  These were all done under Latex-free  conditions as she does have a LATEX allergy.   HOSPITAL COURSE:  The patient was admitted postoperatively for pain control,  DVT prophylaxis, and physical therapy.  Postop day #1, the patient is  complaining about a significant amount of pain.  She forgets to push her PCA  button.  She is sitting up comfortably, CPM 0 to 60, T-max. 100.2,  temperature when examined 98.  Her hemoglobin was 9.2.  Her leg was dressed  with no  excess drainage.  She underwent a dressing change postop day #1.  Her PCA was discontinued.  She was placed on p.o. pain medicine.  She was  evaluated by physical therapy.  She was moderate assistance in bed mobility,  2+ total assistance to ambulate to a chair.   On postop day #2, the patient was better on p.o. pain medicine.  Hemoglobin  was 8.5.  Her INR was 2.2.  Her Foley was discontinued.  Her IV was heparin  locked.  Her dressing was changed.   On Postop day #3, surgical wound was well approximated.  Her hemoglobin was  7.7.  Her potassium was 3.3.  She was transfused 2 units of packed red blood  cells with 10 Lasix between units, was also give 40 mEq K-Dur t.i.d. just  that day.  She continued in physical therapy and was transferred to rehab in  stable condition following her blood transfusion.  We will continue to follow  her on rehab.  She is weightbearing as tolerated, on a regular diet.   DISCHARGE MEDICATIONS:  1. She is doing well on Dilaudid for pain 2 mg 1 to 2 q.4h. p.r.n. pain.  2. Coumadin for anticoagulation  3. Nexium 40 mg 1 p.o. daily.  4. Lipitor 80 mg q p.o. daily.  5. Vasotec 20 mg 1 p.o. daily.  6. Toprol XL 50 mg 1 p.o. daily.  7. Norvasc 5 mg 1 p.o. daily.  8. Colestid 5 mg 1 p.o. daily.   ACTIVITY:  Weightbearing as tolerated.   DIET:  Regular.   FOLLOW UP:  We will continue to follow her on rehab.      [name], P.A.                  Robert A. Thurston Hole, M.D.    KS/MEDQ  D:  08/28/2003  T:  08/28/2003  Job:  [id number]

## 2010-09-04 NOTE — Discharge Summary (Signed)
NAMELASONDRA, HODGKINS                         ACCOUNT NO.:  [id number]   MEDICAL RECORD NO.:  [id number]                   PATIENT TYPE:  INP   LOCATION:  6531                                 FACILITY:  [facility]   PHYSICIAN:  Richard A. Alanda Amass, M.D.          DATE OF BIRTH:  [birth year]   DATE OF ADMISSION:  10/29/2003  DATE OF DISCHARGE:                                 DISCHARGE SUMMARY   ADDENDUM:  In reviewing Ms. Erisman's discharge medications, she actually is  taking samples of Caduet 5/40, she takes two a day.      Abelino Derrick, P.A.                      Richard A. Alanda Amass, M.D.    Lenard Lance  D:  10/31/2003  T:  10/31/2003  Job:  [id number]

## 2010-09-04 NOTE — Op Note (Signed)
NAMEKHRISTIE, [name]               ACCOUNT NO.:  [id number]   MEDICAL RECORD NO.:  [id number]          PATIENT TYPE:  AMB   LOCATION:  ENDO                         FACILITY:  [facility]   PHYSICIAN:  Vania Rea. Jarold Motto, M.D. [facility] OF BIRTH:  06/09/1921   DATE OF PROCEDURE:  05/12/2004  DATE OF DISCHARGE:  05/12/2004                                 OPERATIVE REPORT   PROCEDURE:  Esophageal manometer.   [name] is an 77 year old white female with chronic acid reflux and  recurrent dysphagia.  Esophageal manometer is completed on May 12, 2004.  Results are as follows.   1.  Upper esophageal sphincter:  Upper esophageal sphincter pressure in      relaxation appears to be normal.  2.  Lower esophageal sphincter:  Mean pressure in the lower esophageal      sphincter is less than 10 mmHg with complete relaxation swallowing.  3.  Motility pattern:  Normally propagated peristaltic waves with normal      amplitude and duration __________ esophagus wet and dry swallow.  Mean      esophageal amplitude is 75 mmHg.   ASSESSMENT:  This manometer shows a very incompetent lower esophageal  sphincter with normal esophageal peristalsis.  This patient does have  chronic gastroesophageal reflux disease.      DRP/MEDQ  D:  05/15/2004  T:  05/15/2004  Job:  [id number]   cc:   Angelia Mould. Derrell Lolling, M.D.  [address]  [place]  [place] [address]

## 2010-09-04 NOTE — Discharge Summary (Signed)
[name], [name]                         ACCOUNT NO.:  [id number]   MEDICAL RECORD NO.:  [id number]                   PATIENT TYPE:  INP   LOCATION:  6531                                 FACILITY:  [facility]   PHYSICIAN:  Richard A. Alanda Amass, M.D.          DATE OF BIRTH:  January 02, 1922   DATE OF ADMISSION:  10/29/2003  DATE OF DISCHARGE:                                 DISCHARGE SUMMARY   DISCHARGE DIAGNOSES:  1. Unstable angina, treated with right coronary artery stenting this     admission.  2. Coronary disease, coronary artery bypass grafting x2 October 1993.  3. Peripheral vascular disease with right superficial femoral artery     pecutaneous transluminal angioplasty in 1997.  4. Treated hypertension.  5. Treated hyperlipidemia.  6. Gastroesophageal reflux and hiatal hernia.   HOSPITAL COURSE:  The patient is a delightful 77 year old female followed by  Dr. Alanda Amass.  She had bypass surgery in 1993 with a LIMA to LAD and an SVG  to her diagonal.  She had a negative Cardiolite study in September 2004.  She was recently seen in the emergency room at [facility] October 24, 2003 for epigastric discomfort.  It was worse when she laid flat.  It was  partially relieved by belching but it did radiate to her jaw.  Her EKG and  enzymes were negative at [facility] and she was set up to follow up  with Dr. Alanda Amass.  We saw her in the office October 29, 2003.  She did not  have recurrent symptoms.  Her EKG was abnormal with new T wave inversion.  Ultimately, it was decided to admit her for diagnostic catheterization.  This was done October 31, 2003 and revealed a 99% RCA.  She was dilated and  stented with a Cypher stent by Dr. Elsie Lincoln.  The LIMA to LAD and SVG to the  diagonal were patent and the circumflex was without significant disease.  She had a small ramus intermedius branch that had 75% stenosis.  EF was 60%.  She tolerated the procedure well.  We feel she can be discharged  October 31, 2003.   DISCHARGE MEDICATIONS:  1. Coated aspirin once a day.  2. Vasotec 20 mg a day.  3. Caduet 540 daily.  4. Toprol-XL 50 mg a day.  5. Plavix 75 mg a day.  6. Nexium 20 mg a day.  7. Questran one pack daily.  8. Nitroglycerin sublingual p.r.n.   LABORATORY DATA:  White count 4.5, hemoglobin 12.2, hematocrit 35.3,  platelets 213.  Sodium 144, potassium 3.6, BUN 11, creatinine 0.8.  INR 1.0.  Urinalysis unremarkable.  Lipid profile is obtained and pending at the time  of discharge.  Chest x-ray shows a large hiatal hernia, otherwise negative.  EKG is as noted with T wave inversion in II, III, F, and V1-V6.   DISPOSITION:  The patient discharged in stable condition and will follow  up  with Dr. Alanda Amass in a previously-scheduled appointment November 19, 2003.      Abelino Derrick, P.A.                      Richard A. Alanda Amass, M.D.    Lenard Lance  D:  10/31/2003  T:  10/31/2003  Job:  [id number]   cc:   Gerlene Burdock A. Alanda Amass, M.D.  [phone] [address]  [place]  [place] [address]  Fax: [phone]

## 2010-09-04 NOTE — Op Note (Signed)
[name], [name]                       ACCOUNT NO.:  [id number]   MEDICAL RECORD NO.:  [id number]                   PATIENT TYPE:  INP   LOCATION:  2899                                 FACILITY:  [facility]   PHYSICIAN:  [name]., M.D.            DATE OF BIRTH:  November 24, 1921   DATE OF PROCEDURE:  06/20/2002  DATE OF DISCHARGE:                                 OPERATIVE REPORT   PREOPERATIVE DIAGNOSES:  1. Umbilical hernia.  2. Symptomatic gallstones.   POSTOPERATIVE DIAGNOSES:  1. Umbilical hernia.  2. Symptomatic gallstones.   OPERATION:  Laparoscopic cholecystectomy with operative cholangiogram and  repair of umbilical hernia.   SURGEON:  [name], M.D.   ASSISTANT:  [name], M.D.   ANESTHESIA:  General.   DESCRIPTION OF PROCEDURE:  After the patient was monitored and anesthetized  and had routine preparation and draping of the abdomen, I infiltrated local  anesthetic just above the umbilicus at the site of a palpable hernia defect  and hernia. I made a short downwardly turned transverse incision and went  down to the hernia contents and separated the fat which was in the hernia  from the umbilical skin and the surrounding tissues. When I opened the sac,  I found that it contained preperitoneal fat and I removed the contents. I  then placed a #0 Vicryl pursestring suture around the hernia defect and  secured a Hasson cannula with it and then inflated the abdomen with CO2 and  put in a scope. I saw no abnormalities except for adhesions on the  undersurface of the gallbladder and slightly inflamed appearing gallbladder.  After anesthetizing three additional sites and placing three additional  ports and positioning the patient head up, foot down and tilted to the left,  retracted the fundus of the gallbladder toward the right shoulder and took  down the adhesions. The infundibulum of the gallbladder was then grasped and  pulled laterally and I  dissected the hepatoduodenal ligament until I clearly  identified the cystic artery and I clipped it with three clips and cut  between the two closest to the gallbladder. I similarly dissected out the  cystic duct and I put a clip on it as it emerged from the infundibulum of  the gallbladder and then made a hole in it and put in a Reddick  cholangiogram catheter secured by one clip. Then I performed a cholangiogram  with fluoroscopy which showed normal size ducts with fast emptying into the  duodenum and no defects. I had noted a little bit of debris in the distal  cystic duct and I milked that out. I then removed the cholangiogram catheter  and clipped the cystic duct distally with three clips and divided it. I  dissected the gallbladder from the liver with cautery clipping a couple more  small vessels and getting good hemostasis with cautery. I detached the  gallbladder intact from the liver  and removed it through the umbilical  incision. We irrigated the right upper quadrant and removed the irrigant and  found hemostasis to be good. I then allowed the CO2 to escape and repaired  the umbilical hernia by mobilizing the fat from the fascia, tying the  Vicryl suture and then reinforcing the defect with a 3 x 3 oval shaped piece  of polypropylene mesh sewn in with 2-0 Prolene suture. I felt the repair was  secure. Hemostasis was not a problem. I closed all incisions with  intracuticular 4-0 Vicryl and Steri-Strips. The patient was stable through  the procedure.                                                [name]., M.D.    WB/MEDQ  D:  06/20/2002  T:  06/20/2002  Job:  [id number]   cc:   [name], M.D. [facility]  [address]  [place]  [place] [address]  Fax: [phone]

## 2010-09-04 NOTE — Procedures (Signed)
[place]. [facility]  Patient:    [name], [name]                         MRN: [id number] Proc. Date: 06/17/99 Adm. Date:  [id number] Attending:  Mardella Layman CC:         Richard A. Alanda Amass, M.D.                           Procedure Report  ENDOSCOPIST:  Vania Rea. Jarold Motto, M.D.  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  It was felt that colonoscopy was indicated to complete her work-up. The risks and benefits of this procedure explained in detail, has agreed to proceed as planned.  Preoperative cardiopulmonary, mental status examinations were unremarkable.  PROCEDURE:  Colonoscopy.  Throughout this procedure the patient was on pulse oximetry and cardiac monitorization.  She was anesthetized with 50 mcg of IV fentanyl and 4 mg of IV Versed.  DETAILS OF PROCEDURE:  Inspection of her rectum was unremarkable as was rectal examination.  Her rectum was intubated using Olympus pediatric variable stiffness colonoscope.   This was advanced through somewhat tortuous and redundant colon nto the cecum.  Base of cecum, including ileocecal valve was unremarkable.  The colonoscope was then slowly withdrawn throughout the length of the colon, which was otherwise free of any significant mucosal or polypoid lesions, including retroflexed view of the rectum.  She was extubated without difficulty, and returned in stable condition to the recovery room for observation.  ASSESSMENT:  This unremarkable colonoscopy to cecum without evidence of adenomatous colon polyps, inflammatory bowel disease, or other abnormalities.  She has constipation, probable irritable bowel syndrome with recurrent rectal bleeding secondary to rectal fissures.  RECOMMENDATIONS:  We will continue her on her IBS regimen and constipation regimen, which has previously been  outlined.  She will have follow-up colonoscopy on a p.r.n. basis as needed per her age. DD:  06/17/99 TD:  06/17/99 Job: [id number]

## 2010-09-04 NOTE — Op Note (Signed)
[place]. [facility]  Patient:    [name], [name]                    MRN: [id number] Proc. Date: 09/08/99 Adm. Date:  [id number] Disc. Date: [id number] Attending:  Marcene Corning                           Operative Report  PREOPERATIVE DIAGNOSES: 1. Left knee torn medial meniscus. 2. Degenerative joint disease.  POSTOPERATIVE DIAGNOSIS: 1. Left knee torn medial meniscus. 2. Degenerative joint disease.  PROCEDURES: 1. Left knee partial medial meniscectomy. 2. Left knee chondroplasty patellofemoral and median compartment.  ANESTHESIA:  Knee block.  SURGEON:  Lubertha Basque. Jerl Santos, M.D.  ASSISTANT:  Prince Rome, P.A.  INDICATIONS FOR PROCEDURE:  The patient is a 77 year old woman with many months of left knee pain.  This initially responded to injection and anti-inflammatories but at this point she has been left with disabling pain, which is starting to affect her back.  She is offered arthroscopy at this point for evaluation and treatment.  The procedure was discussed with the patient and informed operative consent was obtained after discussing the possible complications of reaction to anesthesia and infection.  DESCRIPTION OF PROCEDURE:  The patient was taken to the operating suite, where knee block anesthetic was applied without difficulty.  She was positioned supine and prepped and draped in the normal sterile fashion.  After administration of preoperative antibiotics, an arthroscopy of the left knee was performed through two inferior portals.  Suprapatellar pouch was benign, while the patellofemoral joint exhibited some grade 3 chondromalacia with fairly good tracking.  This was addressed with a chondroplasty back to the flat surface.  Both gutters were benign.  Medial compartment was notable for some grade 4 change on a small portion of the medial femoral condyle with grade 3 changes through most of the rest.  She had a degenerative tear  of the medial meniscus, which was addressed with a partial medial meniscectomy. About 25% of the structure was taken back to a stable rim.  Lateral compartment was benign with no evidence of significant meniscal or articular cartilage injury. The ACL and PCL were intact.  The joint was thoroughly irrigated at the end of the case followed by a placement of Marcaine with epinephrine, morphine, and Depo-Medrol.  Adaptic was placed over the portal followed by dry gauze and loose ______.  Estimated ______ obtained from anesthesia records.  DISPOSITION:  The patient was taken to the recovery room in stable condition. Plans were for her to go home the same day and to follow up in the office in less than a week.  I will contact her by phone tonight. DD:  09/08/99 TD:  09/12/99 Job: [id number]

## 2010-09-04 NOTE — Op Note (Signed)
NAMEBRIANNE, MAINA               ACCOUNT NO.:  [id number]   MEDICAL RECORD NO.:  [id number]          PATIENT TYPE:  INP   LOCATION:  0011                         FACILITY:  [facility]   PHYSICIAN:  Angelia Mould. Derrell Lolling, M.D.DATE OF BIRTH:  1922/03/17   DATE OF PROCEDURE:  08/05/2004  DATE OF DISCHARGE:                                 OPERATIVE REPORT   PREOPERATIVE DIAGNOSIS:  1.  Gastroesophageal reflux disease.  2.  Para esophageal hernia.   POSTOPERATIVE DIAGNOSIS:  1.  Gastroesophageal reflux disease.  2.  Para esophageal hernia.   OPERATION PERFORMED:  Laparoscopic reduction and repair of paraesophageal  hernia, laparoscopic Nissen fundoplication.   SURGEON:  Angelia Mould. Derrell Lolling, M.D.   FIRST ASSISTANT:  Sharlet Salina T. Hoxworth, M.D.   OPERATIVE INDICATIONS:  This is an 77 year old white female who has been  having problems with epigastric discomfort, belching and a feeling of  fullness after she eats. She has a lot of heartburn which is moderately  controlled with double dose Nexium. She says that things are getting worse.  She had an endoscopy which showed a small distal esophageal stricture and a  hiatal hernia being at least 10 cm in length. An upper GI series in November  2005 showed a large hiatal hernia which does not reduce and which looks to  be a combined sliding and paraesophageal hernia. She has had a manometry  which shows good esophageal peristalsis, an incompetent lower esophageal  sphincter. She has had preop cardiac evaluation and was felt to be low risk  despite her history of coronary artery disease. She was offered surgical  repair and after a lengthy discussion, she decided that she wanted to have  that done. She is brought to operating room electively.   OPERATIVE FINDINGS:  The patient had a moderate-sized combined sliding and  paraesophageal hernia. Probably no more than 1/3 of the stomach was up in  the chest and  it reduced reasonably easily. The hernia  sac was complex and  took a long time to dissect but ultimately we were able to completely expose  the intra-abdominal esophagus, the angle of His,  the fundus of the stomach  and both diaphragmatic crura and repair them. The liver otherwise looked  normal. The stomach and duodenum and large intestine were grossly normal.  There is no ascites.   OPERATIVE TECHNIQUE:  Following the induction of general endotracheal  anesthesia, a Foley catheter was inserted and the patient's abdomen was  prepped and draped in sterile fashion. The patient was identified. Marcaine  0.5% with epinephrine was used as a local infiltration anesthetic. A 10 mm  Optiview trocar was inserted just to the left of the umbilicus and slightly  above the umbilicus. This went in nicely. Pneumoperitoneum was created. A  video camera was inserted with visualization and findings as described  above. Two 10 mm trocars were placed in the right upper quadrant. A 10 mm  and a 5 mm trocar were placed the left upper quadrant. A 5 mm trocar was  placed in the subxiphoid region and then removed and the [facility]  was inserted to retract the left lobe of the liver. This gave [place] good  exposure.   We reduced the stomach out of the chest. Using the harmonic scalpel, we  divided the gastrohepatic ligament. The patient had a large blood vessel to  the right of the esophagus going toward the liver probably at least 3 mm in  diameter. We were able to preserve this by dissecting it away from the crus  and simply letting it fall to the right. We then dissected the right crus  away from the peritoneal tissues in the esophagus. We continued the  dissection around anteriorly identifying the apex of the crura and then we  took the dissection down part way on the left crus. Also using a harmonic  scalpel, we took down the short gastric vessels and mobilized the fundus of  the stomach quite easily. The short gastric vessels had been  stretched by  the hernia and it was not very difficult to stay away from the spleen. After  the fundus was completely mobilized then we completed the dissection of the  left crus all the way back posteriorly. We then looked at the right side  again and we were able to lift the esophagus up and create a window  atraumatically and place a Penrose drain around the  esophagus. We then  spent some time debriding the hernia sac away from the intra-abdominal  esophagus and the stomach and removed that from the abdominal cavity. We  took as much of the sac and adhesions down as we could. Once we did this, we  could clearly see both the right and left crus. We had about 4-5 cm of intra-  abdominal esophagus, the angle of his was very floppy but we did identify  that as well.   We inserted a 50 French lighted bougie into the esophagus and used that as a  gauge. We closed the diaphragm posteriorly with three interrupted sutures of  #0 Surgidek. These was simple sutures tied over Teflon pledgets. This  appeared to be an adequate closure. We then set up the fundoplication by  passing the fundus from the left side around posteriorly to the right and  then picking a suitable piece of fundus on the left side and making sure  that they were continuous and could wrap around the esophagus. There was a  slightly thinned out area of anterior esophagus were a little bit of the  longitudinal muscle fibers had been separated but the circular muscle fibers  were intact. We were able to cover this with the wrap. The Nissen  fundoplication was performed with three interrupted sutures of #0 Surgidek.  These were placed on the fundus on the left, anterior wall the esophagus and  then fundus on the right and then tied with the Tie-knot. device. A  Photograph was taken of the completed repair. We inspected the spleen, the liver, paraesophageal tissues and noted that there was no bleeding.  Anesthesia did note that the  patient had some subcutaneous crepitus in the  neck and face and I felt that that was not unexpected considering the extent  of the mediastinal dissection. I did not see any evidence that we had  entered the pleura anywhere but we decided to take a  chest x-ray postop.   After checking for bleeding and finding none, we removed the dilator and all  tubes from the esophagus. We removed the trocars under direct vision. There  was bleeding from the  medial trocar in the right upper quadrant, not  excessively, but I decided to close this with a #0 Vicryl suture passed  through the fascia using an Endoclose device. This controlled the bleeding  completely. We then removed all the rest of the trocars and there was no  bleeding.  Pneumoperitoneum was released. Skin incisions were closed with skin staples.  Clean bandages were placed and the patient taken to the recovery room in  stable condition. Estimated blood loss was about 50 mL. Complications none.  Sponge, needle and instrument counts were correct.      HMI/MEDQ  D:  08/05/2004  T:  08/05/2004  Job:  [id number]   cc:   Vania Rea. Jarold Motto, M.D. [facility]   Richard A. Alanda Amass, M.D.  [phone] [address], [address]  [place]  [place] [address]  Fax: [phone]   [facility] [place]  [address]  [place]  [place] [address]  Fax: [phone]

## 2010-09-04 NOTE — Assessment & Plan Note (Signed)
[facility]                         GASTROENTEROLOGY OFFICE NOTE   NAME:[name], [name]                      MRN:          [id number]  DATE:04/05/2006                            DOB:          07-30-1921    SUBJECTIVE:  [name] comes in today stating that she has increasing pain  in her right groin, which certainly sounds like a hernia, in that it is  worse with movement and lifting.  She has had otherwise an extensive  recent GI workup after her repaired para-esophageal hernia by Dr.  Angelia Mould. Derrell Lolling this past spring.  This included a negative endoscopy  on March 23, 2006, and a normal small bowel series on March 25, 2006, with subsequent delayed gastric emptying, without obstruction.  She has been placed on Reglan 5 mg t.i.d., with improvement.  She had a  CT scan, last performed in July 2006, which was unremarkable.  I cannot  see where a hernia was demonstrated at that time.  She now reveals that  she saw Dr. Griffith Citron in the spring of this year for a second  opinion, and was told that she had a hernia.  We do not have those  records for exam.   On looking at the patient's chart, she has had a previous right femoral  artery puncture for angiography purposes, and does have peripheral  vascular disease.  Her pain certainly at this time does not sound like  ischemic bowel problems.   [name] has been very anxious about her health and has had no improvement  since repair of her large para-esophageal hernia.  She is almost at the  point of having panic attacks.   PHYSICAL EXAMINATION:  GENERAL:  Today shows her to be in no acute  distress.  VITAL SIGNS:  She weighs 146 pounds, which is a normal weight.  Blood  pressure is 170/72, pulse 80 and regular.  ABDOMEN:  She had a well-healed surgical scar, but otherwise she had no  organomegaly, masses or significant tenderness.  She did appear to have  a non-specific ventral hernia of the  lower abdomen with straight leg  raising, but I could not appreciate any definite inguinal hernia,  although there was some tenderness over the femoral triangle area.   ASSESSMENT:  It is certainly possible that [name] has a femoral hernia,  accounting for a lot of her problems.  She has multiple other medical  problems that are followed, mostly by Dr. Pearletha Furl. [name], who is  her primary care physician.   RECOMMENDATIONS:  I referred her back to [facility] for an  opinion as to whether or not she needs anything done for a possible  inguinal-femoral hernia in her right groin area.  She is on multiple  medications and needs to follow up with her primary care physician,  whomever that may be.  We have done extensive gastrointestinal workups  otherwise, as mentioned above.  She is to continue her other medications  in the interim.  If surgery does not  feel that she has a surgical problem, I think we  need to establish her  with primary care, for ongoing problems and rather severe associated  anxiety syndrome.     Vania Rea. Jarold Motto, MD, Caleen Essex, FAGA  Electronically Signed    DRP/MedQ  DD: 04/05/2006  DT: 04/05/2006  Job #: [id number]   cc:   Angelia Mould. Derrell Lolling, M.D.  [name], M.D.

## 2010-09-04 NOTE — Assessment & Plan Note (Signed)
[facility]                           GASTROENTEROLOGY OFFICE NOTE   NAME:BOLINGTajha, [name]                      MRN:          [id number]  DATE:03/15/2006                            DOB:          Jul 28, 1921    [name] continues to complain of epigastric pain with intermittent nausea and  vomiting.  All of her symptoms occur 30 minutes after meals and relieved by  Levsin.  She has had problems ever since repair of her hiatal hernia and  paraesophageal hernia in April 2006 by Dr. Derrell [name].  Workup in the spring of  2006 showed a normal CT scan and upper GI series.   She denies lower GI complaints, hepatobiliary problems, dysphagia, anorexia  or weight loss.  She does have coronary artery disease and is on Vasotec 20  mg a day, Toprol-XL 50 mg a day, aspirin 81 mg a day, Caduet 10/80 mg a day,  and daily probiotic therapy along with p.r.n. hydrochlorothiazide.  She is  followed by Dr. Alanda [name] for cardiovascular problems and does have  apparently a history of TIAs and is chronically anticoagulated as mentioned  above. Her previous surgery was performed because of recurrent reflux  symptoms and episodes of gastric torsion.  She has also in the past had  esophageal strictures.  Her last colonoscopy exam was in July 2006 and  otherwise was unremarkable.  At that time she did have some small colon  polyps excised.  Biopsies of her colon at that time showed no significant  changes.   She weighs 147 pounds, which is pretty much her normal weight for the last  year.  Blood pressure 122/72 and pulse was 68 and regular.  I could not  appreciate stigmata of chronic liver disease.  Her abdominal exam showed no  hepatosplenomegaly, masses, or significant tenderness at this time.  Bowel  sounds were normal and very active in quality.   ASSESSMENT:  I remained perplexed about [name] continued abdominal pain  which has not improved since repair of her large  diaphragmatic and  paraesophageal hernia.  She is status post cholecystectomy.  Her symptoms do  not sound like intermittent small-bowel obstruction since they occur after  every meal.  She denies abuse of NSAIDs but is on 81 mg of aspirin a day and  is not on a PPI at this time.  It is certainly possible that she has erosive  gastric mucosal disease.   RECOMMENDATIONS:  1. Outpatient followup endoscopy and ultrasonography.  2. Check screening lab tests.  3. Restart AcipHex 20 mg twice a day before meals, along with p.r.n.      Levsin.  4. Continue other medications as per Dr. Alanda [name].     [name] Rea. Jarold Motto, MD, Caleen Essex, FAGA  Electronically Signed    DRP/MedQ  DD: 03/15/2006  DT: 03/15/2006  Job #: [id number]   cc:   [name], M.D.  [name], M.D.  [name]

## 2010-09-04 NOTE — Op Note (Signed)
NAMESTEPHANIA, [name]               ACCOUNT NO.:  [id number]   MEDICAL RECORD NO.:  [id number]          PATIENT TYPE:  OIB   LOCATION:  1621                         FACILITY:  [facility]   PHYSICIAN:  Angelia Mould. Derrell Lolling, M.D.DATE OF BIRTH:  23-Jul-1921   DATE OF PROCEDURE:  06/30/2006  DATE OF DISCHARGE:  07/01/2006                               OPERATIVE REPORT   PREOPERATIVE DIAGNOSIS:  Right inguinal hernia.   POSTOPERATIVE DIAGNOSIS:  Right inguinal hernia.   OPERATION PERFORMED:  Repair right inguinal hernia with mesh  Armanda Heritage repair).   SURGEON:  Angelia Mould. Derrell Lolling, M.D.   FIRST ASSISTANT:  Ardeth Sportsman, MD.   OPERATIVE INDICATIONS:  This is a 77 year old white female who has  developed symptomatic right inguinal hernia that pops in and out and  causes some pain in the right suprapubic area.  She feels a bulge but is  able to push it back in.  On exam, she has what appears to be a  reducible right inguinal hernia.  She was offered elective repair which  she wanted to have and she is brought to the operating room electively.   OPERATIVE TECHNIQUE:  Following the induction of a general LMA  anesthetic, intravenous antibiotics were given prior to the incision.  The patient was identified as to correct patient and correct procedure  and correct side.  The lower abdomen and groin was prepped and draped in  the sterile fashion.  We used 0.5% Marcaine with epinephrine as local  infiltration anesthetic.  A transverse skin crease incision was made  overlying the right inguinal canal.  Dissection was carried down through  subcutaneous tissue exposing the aponeurosis of the external oblique.  The external oblique was incised in the direction of its fibers, opening  up the external inguinal ring.  The external oblique was dissected away  from the underlying tissues and self-retaining retractors were placed.   I found that she had a moderately large indirect right inguinal hernia.  I dissected medially and divided the round ligament at the level of the  pubic tubercle.  I then dissected the round ligament all the way back up  to the level of the internal ring and found that I could dissect and  separate it from the hernia sac.  The round ligament was clamped at the  level of the internal ring, amputated, discarded and ligated with a 2-0  Vicryl tie.  The hernia was simply reduced and the overlying floor of  the inguinal canal oversewn with a running suture of 2-0 Vicryl.  The  floor of the inguinal canal was repaired and reinforced with an onlay  graft of polypropylene mesh.  A 3 inch x 6 inch piece of polypropylene  mesh was brought into operative field and trimmed at the corners to fit  the wound.  The mesh was sutured in place with interrupted sutures and  running sutures of 2-0 Prolene.  The mesh was sutured so as to  generously overlap the fascia at the pubic tubercle, then along the  inguinal ligament inferiorly.  Interrupted sutures of Prolene were  placed medially and superior medially.  Superiorly and superolaterally,  a running suture of 2-0 Prolene was placed and then the two running  sutures were tied laterally.  This provided a very secure repair  throughout the inguinal canal both medial and lateral to the internal  ring.  Hemostasis was excellent.  The wound was irrigated with saline.  The external oblique was closed with a running suture of 2-0 Vicryl.  Scarpa's fascia was closed with running suture of 3-0 Vicryl and the  skin closed with running subcuticular suture of 4-0 Monocryl and Steri-  Strips.  Clean bandages were placed and the patient taken to recovery  room in stable condition.   ESTIMATED BLOOD LOSS:  About 10 mL or less.   COMPLICATIONS:  None   Sponge, needle and instrument counts were correct.      Angelia Mould. Derrell Lolling, M.D.  Electronically Signed     HMI/MEDQ  D:  06/30/2006  T:  07/01/2006  Job:  [id number]   cc:   Vania Rea.  Jarold Motto, MD, FACG, FACP, FAGA  [address]  [place]  [place] [address]   Richard A. Alanda Amass, M.D.  Fax: [phone]   Albertina [place]

## 2010-09-04 NOTE — Cardiovascular Report (Signed)
[name], [name]                         ACCOUNT NO.:  [id number]   MEDICAL RECORD NO.:  [id number]                   PATIENT TYPE:  INP   LOCATION:  6531                                 FACILITY:  [facility]   PHYSICIAN:  Madaline Savage, M.D.             DATE OF BIRTH:  [birth year]   DATE OF PROCEDURE:  10/30/2003  DATE OF DISCHARGE:                              CARDIAC CATHETERIZATION   PROCEDURES PERFORMED:  1. Inpatient selective coronary angiography by Judkins technique.  2. Retrograde left heart catheterization.  3. Left ventricular angiography.  4. Percutaneous coronary intervention to the mid right coronary artery with     stenting with a Cypher stent.   COMPLICATIONS:  None.   ENTRY SITE:  Right femoral.   DYE USED:  Omnipaque.   MEDICATIONS GIVEN:  Weight-adjusted heparin resulting in an ACT of 246  seconds, double bolus integrilin infused at the beginning of the case,  during the case and for 18 hours post procedure.  Fentanyl 25 mg IV for  sedation which worked well.  Intracoronary nitroglycerin 200 mcg once,  intravenous nitroglycerin by drip beginning at 10 mcg per hour, increased to  20 mcg per hour during the case.   PATIENT PROFILE:  The patient is a very pleasant 77 year old woman with  previous coronary bypass grafting 12-13 years ago who had left anterior  descending coronary artery bypass with an internal mammary and a saphenous  vein graft was placed to a diagonal branch of the LAD.   Within the last few weeks before hospital entry, the patient complained of a  peculiar pain in her chest that seemed more like a discomfort than a true  pain.  One more severe episode prompted a visit to our office where our Dr.  Alanda Amass is her primary M.D.  He was out of town and I saw the patient  along with Corine Shelter, P.A., and assessed the patient and noted on her EKG  that there were new T-wave inversions in the inferior leads II, III and aVF  and in  anterolateral V leads V3 through V6.  These changes were new since  last tracing.  The patient was therefore admitted and baseline laboratories  obtained.  The patient presents to the cath lab today electively having been  free of pain overnight.  Cardiac enzymes have been negative for acute  myocardial infarction.   RESULTS:   PRESSURES:  Left ventricular pressure was 150/7, end-diastolic pressure 10.  Central aortic pressure 150/70, mean of 100.  No aortic valve gradient by  pullback technique.   ANGIOGRAPHIC RESULTS:  The patient did not have pericardial, valvular or  coronary calcification on fluoroscopy.   1. The left main coronary artery was a pristine-looking vessel that was     large in diameter and contained no lesions throughout its course.  In     terms of length, it was a medium length vessel.  The left main has three     branches which include an intermediate ramus branch, co-dominant     circumflex branch and an LAD.  The LAD is 100% occluded proximally and     there is no antegrade flow into the mid and distal vessel.  2. The circumflex is a large vessel which becomes tortuous distally.  There     is a lesion of approximately 30-40% at the distal end of the vessel.     There are lumpy, bumpy irregularities throughout the proximal and mid     portion of vessel.  This vessel, however, is very large and appears to be     about 3.5 mm in diameter.  The one and only circumflex obtuse marginal     branch that is noted arises proximally and contains no significant     lesions.  There is a faint retrograde collateral to the distal RCA by way     of this first obtuse marginal branch.  3. There is an intermediate ramus branch which arises from the left main     coronary artery that has a 60-75% stenosis proximally.  This vessel is     fairly small and is potentially amenable to percutaneous intervention,     but that would be on the basis of demonstrating coronary ischemia on a      functional stress test.  4. The right coronary artery is a co-dominant vessel with the circumflex     which gives rise to two acute marginal branches, a pulmonary conus branch     proximally and a sinus node branch.  Distally, the vessel bifurcates into     a posterior descending branch of medium size and a posterior lateral     branch which has three tertiary branches.  5. The right coronary artery in the mid portion of the vessel contains a     type B lesion which is a gradually tapering stenosis that becomes severe     to 99% and then gradually becomes a relatively normal vessel.  It is     approximately 10 mm in length and is adjacent and proximal to the first     acute marginal branch.  There is TIMI-3 distal flow through this stenotic     vessel.  This right coronary artery stenosis produces T-wave inversion on     the EKG very similar to what the patient has at baseline and is felt to     be her culprit vessel responsible for her recent angina.  6. The saphenous vein graft to diagonal contains lumpy, bumpy irregularities     in the mid portion and in the distal third of the vein graft.  At no     point does it seem to be more than 40% severe.  There is TIMI-3 flow to a     fairly large diagonal branch off of the LAD and that distal diagonal     appears free of any significant obstructive lesions.  7. The distal anastomotic site of this saphenous vein graft is pristine.  8. The internal mammary artery graft is widely patent throughout containing     no lesions whatsoever.  It anastomoses into an LAD and supplies equal     amounts distally and in a retrograde fashion into the LAD almost back to     the point of complete occlusion in the native vessel.  No lesions are     seen in the LAD  of any significance and it is a medium size vessel.  We     do see septal perforator branch collaterals to the distal RCA.  9. The left ventricle shows excellent contractility with absence of wall     motion abnormality.  I would estimate ejection fraction at 60-70%.  I see     1+ mitral regurgitation and no evidence of prolapsing mitral valve.  The     aortic root appears to be upper limits of normal in terms of its     transverse diameter.   PERCUTANEOUS INTERVENTION:  This was accomplished using weight-based  adjusted heparin resulting in an ACT of 240 seconds.  The hardware used for  this procedure included a 6 [place] JR-4 guide catheter, a 180-cm Patriot  wire by [employer] and predilatation balloon with a 2.0 x 15-mm  Maverick balloon.  The guide wire  crossed the lesion with ease.  The guide  catheter provided excellent backup which never came into question during the  entirety of the procedure.  The guide wire became positioned distally with  some prolapse of the distal tip which ensured safety from seeking outside  branches.  The Maverick balloon then crossed the lesion with relative ease  and was deployed once to 6 atmospheres of pressure resulting in marked ST  segment elevation and mild clinical chest pain.  This provided an opening  into the vessel which then allowed crossing with a 3.0 x 18-mm Cypher stent  by [employer] which was deployed once to 14 atmospheres of pressure.   Post dilatation of this 3.0 stent was then accomplished with a Quantum  Maverick balloon by [facility] [employer] 3.25 x 15-mm in diameter.  I was  careful to stay within the stent boundaries and deployed this balloon  aggressively to 16 atmospheres of pressure which would ensure a lumen  diameter of 3.33 mm.  I did that twice for approximately 30-40 seconds and  my reason for deflation was marked ST elevation and chest pain.   Following final inflation, the patient was chest pain free on IV  nitroglycerin. ST segments improved and there was TIMI-3 flow distally with  complete resolution of 99% stenosis to 0% residual.  There was an exact  match between the RCA native vessel itself and the outer  boundaries of the  stent and the procedure was considered a success without complication.   FINAL DIAGNOSES:  1. Recent stable angina with abnormal EKG associated.  2. Coronary artery bypass grafting 12 years ago with internal mammary artery     bypass to left anterior descending and saphenous vein graft to diagonal.  3. Patent saphenous vein graft to diagonal and patent left internal mammary     artery to left anterior descending.  4. 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  5. Normal left ventricular systolic function.   PLAN:  The patient will get 300 mg of Plavix in the cath lab.  She will get  300 mg of Plavix at supper time equaling 600 mg which is her needed dose.  She will receive integrilin for the next 18 hours until Plavix has produced  anticipated and predicted antiplatelet effect and will ambulate six hours  post cath and be eligible for discharge October 31, 2003.  Madaline Savage, M.D.    WHG/MEDQ  D:  10/30/2003  T:  10/30/2003  Job:  [id number]   cc:   Gerlene Burdock A. Alanda Amass, M.D.  [phone] [address], [address]  [place]  [place] [address]  Fax: [phone]

## 2010-09-04 NOTE — Discharge Summary (Signed)
[name], [name]               ACCOUNT NO.:  [id number]   MEDICAL RECORD NO.:  [id number]          PATIENT TYPE:  INP   LOCATION:  0362                         FACILITY:  [facility]   PHYSICIAN:  Angelia Mould. Derrell Lolling, M.D.DATE OF BIRTH:  May 10, 1921   DATE OF ADMISSION:  08/05/2004  DATE OF DISCHARGE:  08/10/2004                                 DISCHARGE SUMMARY   FINAL DIAGNOSES:  1.  Paraesophageal hernia.  2.  Gastroesophageal reflux disease.  3.  Coronary artery disease  4.  History of deep venous thrombosis, left leg, with chronic venous stasis.  5.  Hypertension.  6.  Irritable bowel syndrome.   OPERATION PERFORMED:  Laparoscopic reduction and repair of hiatal hernia,  laparoscopic Nissen fundoplication.   HISTORY:  This is a pleasant 77 year old white female who has been having  problems with epigastric discomfort, excessive belching, a feeling of  fullness after she eats. She has lots of heartburn but does not vomit. This  is moderately well-controlled with double dose Nexium. She has water brash  at night. She actually has spontaneous food regurgitation. This is getting  worse despite double dose Nexium. She underwent extensive workup including  colonoscopy which was normal, an upper endoscopy which showed a distal  esophageal stricture, and hiatal hernia. Upper GI showed a large hiatal  hernia which looked like a paraesophageal type. Duodenal diverticulum was  noted. Ultrasound of gallbladder was normal. She had previously had a  cholecystectomy. She was evaluated as an outpatient. Surgical repair of her  hernia and entire reflux surgery was recommended and desired by the patient.  She had a manometry which showed incompetent lower esophageal sphincter but  good peristalsis. She underwent preop cardiac evaluation by Dr. Susa Griffins. She underwent Persantine Cardiolite showing an ejection fraction  of 89%, felt to be low risk for general anesthesia.   HOSPITAL  COURSE:  On the day of admission, the patient was taken to  operating room and underwent laparoscopic reduction and repair of her  paraesophageal hernia and laparoscopic Nissen fundoplication. The surgery  was uneventful.   Postoperatively, she was followed by Dr. Susa Griffins and his group,  and she was maintained on parenteral cardiac medications and remained quite  stable from a cardiac standpoint. She underwent a went Gastrografin swallow  on August 06, 2004 and that showed a satisfactory postoperative Nissen  fundoplication, no obstruction, and no leak. She was started on a clear  liquid diet at that time and that was progressed to full liquids without  much difficulty. We progressed her activities as well. She felt well and was  able to be discharged on August 10, 2004. At that time, she was tolerating  full liquid diet, had had have bowel movements, was afebrile, feeling good,  wounds  looked fine, but just took her staples out, and Steri-Stripped her wound.  She was given a prescription for Darvocet-N 100 for pain. She was told to  continue her Nexium and her cardiac medications and asked to return to see  me in two weeks.      HMI/MEDQ  D:  09/01/2004  T:  09/01/2004  Job:  [id number]   cc:   Vania Rea. Jarold Motto, M.D. [facility]   Richard A. Alanda Amass, M.D.  [phone], [address]  [place]  [place] [address]  Fax: [phone]   Albertina Senegal  [address]  [place]  [place] [address]  Fax: [phone]

## 2010-09-04 NOTE — Op Note (Signed)
[name], [name]                       ACCOUNT NO.:  [id number]   MEDICAL RECORD NO.:  [id number]                   PATIENT TYPE:  INP   LOCATION:  5039                                 FACILITY:  [facility]   PHYSICIAN:  Elana Alm. Thurston Hole, M.D.              DATE OF BIRTH:  1922-02-13   DATE OF PROCEDURE:  07/22/2003  DATE OF DISCHARGE:                                 OPERATIVE REPORT   PREOPERATIVE DIAGNOSIS:  Left knee degenerative joint disease.   POSTOPERATIVE DIAGNOSIS:  Left knee degenerative joint disease.   PROCEDURE:  Left total knee replacement using Osteonics Scorpio total knee  system with #7 cemented femur, #7 cemented tibia, with 12-mm polyethylene  flexed tibial spacer, and 26-mm polyethylene cemented patella.   SURGEON:  Elana Alm. Thurston Hole, M.D.   ASSISTANT:  Julien Girt, P.A.   ANESTHESIA:  General.   OPERATIVE TIME:  One hour and 20 minutes.   COMPLICATIONS:  None.   DESCRIPTION OF PROCEDURE:  Ms. Stetzer was brought to the operating room on  07/22/2003, placed on the operative table in the supine position.  After an  adequate level of general anesthesia was obtained, her left knee was  examined under anesthesia.  Range of motion -3 to 125 degrees, mild varus  deformity, knee stable to ligamentous exam with normal patella tracking.  She had a Foley catheter placed under sterile conditions and received Ancef  1 g IV preoperatively for prophylaxis.  Latex allergy precautions were  taken.  Her left leg was then prepped using Hibiclens and alcohol and draped  using sterile technique.  The leg was exsanguinated and a thigh tourniquet  elevated at 365 mm.  Initially, through a 15-cm longitudinal incision based  over the patella, initial exposure was made.  The underlying subcutaneous  tissues were incised along with the skin incision.  A median arthrotomy was  performed revealing excessive amount of normal-appearing joint fluid.  The  articular surfaces were  inspected.  She had grade 4 changes medially, grade  3 changes laterally, and grade 3 and 4 changes in the patellofemoral joint.  The medial and lateral meniscal remnants were removed as well as the  anterior cruciate ligament.  Osteophytes were removed off the femoral  condyles and the tibial plateau.  An intramedullary drill was drilled up the  femoral canal for placement of the distal femoral cutting jig which was  placed in the appropriate amount of rotation and the distal 10-mm cut was  made.  The distal femur was then sized.  A #7 was found to be the  appropriate size, and the #7 cutting jig was placed, and then these cuts  were made.  The proximal tibia was then exposed.  The tibial spines were  removed with an oscillating saw.  Intramedullary drill drilled down the  tibial canal for placement of the proximal tibial cutting jig which was  placed in the appropriate amount of  rotation, and the proximal 4-mm cut was  made.  Scorpio PCL cutter was the placed back on the distal femur, and these  cuts were made.  The #7 femoral trial was placed.  The #7 tibial baseplate  trial was placed, and with a 12-mm polyethylene flexed tibial spacer, there  was found to be excellent restoration of normal alignment, excellent  stability, range of motion 0-125 degrees, with no lift off on the tray.  The  tibial baseplate was then marked for rotation, and the keel cut was made.  The patellar was then sized.  A 26 mm was found to be the appropriate size  and a recessed 10-mm x 26-mm cut was made, and three locking holes were  placed.  After this was done, it was felt that all the trial components were  of excellent size, fit, and stabilities.  They were then removed.  The knee  was then jet lavage irrigated with 3 L of saline solution.  The proximal  tibia was then exposed, and the #7 tibial baseplate trial was hammered into  position with cement backing with excellent fit with excess cement being   removed from the edges.  The #7 femoral component with cement backing was  hammered into position also with an excellent fit with excess cement being  removed from the around the edges.  The 12-mm polyethylene flexed tibial  spacer was then locked on the tibial baseplate.  The knee was taken through  a range of motion from 0-120 degrees with excellent stability, excellent  correction of her varus deformity, with no lift off on the tray.  The 26-mm  polyethylene cemented back patella was then locked and recessed, and held  there with a clamp.  After the cement had hardened, the patellofemoral  tracking was evaluated.  This was found to be normal.  At this point, it was  felt that all components were of excellent size, fit, and stability.  The  knee was further irrigated with saline, and then the arthrotomy was closed  with #1 Ethibond suture over two medium Hemovac drains.  Subcutaneous  tissues were closed with 0 and 2-0 Vicryl and the skin closed with skin  staples.  Sterile dressings were applied.  The Hemovacs were injected with  0.25% Marcaine with epinephrine and clamped.  The tourniquet was released.  The patient was then awakened, extubated, and taken to the recovery room in  stable condition.  Needle and sponge counts were correct x2 at the end of  the case.                                               Robert A. Thurston Hole, M.D.    RAW/MEDQ  D:  07/22/2003  T:  07/22/2003  Job:  [id number]

## 2010-09-04 NOTE — Discharge Summary (Signed)
[name], [name]                       ACCOUNT NO.:  [id number]   MEDICAL RECORD NO.:  [id number]                   PATIENT TYPE:  IPS   LOCATION:  4146                                 FACILITY:  [facility]   PHYSICIAN:  Ranelle Oyster, M.D.             DATE OF BIRTH:  Jun 03, 1921   DATE OF ADMISSION:  07/25/2003  DATE OF DISCHARGE:  08/02/2003                                 DISCHARGE SUMMARY   DIAGNOSES:  1. Left total knee replacement secondary to osteoarthritis.  2. Deep vein thrombosis prophylaxis.  3. Question of neuropathy.  4. History of hypertension.  5. Gastroesophageal reflux disease.  6. History of cardiovascular disease.   HISTORY OF PRESENT ILLNESS:  The patient is an 77 year old white female with  a history of cardiovascular disease status post coronary artery bypass graft  and left knee pain for which she had no relief with conservative care.  She  underwent total knee replacement April 4, by Dr. Elana Alm. Wainer.  She was  placed on Coumadin for DVT prophylaxis.  She presented with complications to  the knee, constipation and hypovolemia.   PT report at this time.  The patient was weightbearing as tolerated, mini  assist 25 feet, transfers were mini assist.  The patient received red blood  cells transfusion prior to admission to rehabilitation.  On July 25, 2003,  the patient was transferred to [facility] Department for more  therapy.   PAST MEDICAL HISTORY:  Significant for hypertension, elevated lipids,  history of deep vein thrombosis, peripheral vascular disease and  gastroesophageal reflux disease.   PAST SURGICAL HISTORY:  Significant for back surgery and bladder tack,  gallbladder, coronary artery bypass graft and left-knee arthroscopy.  Primary Care MD is Dr. Julius Bowels.   ALLERGIES:  CODEINE, IODINE AND LATEX.   SOCIAL HISTORY:  The patient lives alone in [place], independent prior to  admission, in a one-level home with two steps  to entry.  Daughter lives next  store.  Denies any alcohol or tobacco history.   FAMILY HISTORY:  Noncontributory.   HOSPITAL COURSE:  [name] was admitted to [facility] Department on July 25, 2003, for comprehensive patient  rehabilitation.  She received more than three hours of therapy daily.  Overall, she progressed very well during her stay in rehabilitation.  Once  her pain was under control, and neuropathy was under control, burning pain  was under control, the patient progressed very well.  She was discharged at  modified independent level.  The patient was started on Neurontin 300 mg  p.o. q.h.s. on July 30, 2003, due to burning pain around her left knee and  left foot.  After addition of this medication, the burning pain did  completely improve.  Prior to admission, the patient had hemoglobin of 7.7.  As stated in HPI, the patient received two units of packed red blood cells.  Post transfusion  hemoglobin was 12.7 and hematocrit was 36.7.   The patient remained on Coumadin throughout her entire stay for DVT  prophylaxis without any complications noted.  The patient remained on Nexium  40 mg daily for GI prophylaxis as well as history of GERD.  Blood pressure  was slightly elevated while in rehabilitation, but did improve.  Norvasc was  increased from 5 mg to 10 mg p.o. daily on July 26, 2003.  The patient to  follow up with primary care Louie Flenner regarding blood pressure.  The pain was  under good control with Ultram.  The patient did receive a course of  potassium supplement on July 26, 2003, due to hypokalemia.  After addition  of K-Dur, the potassium levels normalized.  She remained on Lipitor 80 mg  p.o. daily for a history of hypercholesterolemia.  There were no other major  issues that occurred while the patient was in rehabilitation.   LABORATORY DATA:  Latest labs, INR was 2.3, latest sodium was 133, potassium  4.0, chloride 101, chloride  101, glucose 104, BUN 10, creatinine 3.7, AST  30, ALT 20, alkaline phosphatase 92.  Hemoglobin A1c was performed on July 29, 2003.  It was 5.9 due to elevated BUN.  Glucose level of July 26, 2003,  was 210.  Urine culture was performed on July 26, 2003.  Multiple species  present.  No __________ isolated.  Hemoglobin was 12.7, hematocrit 36.7,  white blood cell count 5.8, platelets count 239,000.   DISCHARGE PHYSICAL EXAM:  At the time of discharge, all vitals were stable.  The patient had about 2+ edema, not erythematous.  Staples were removed, and  Steri-Strips were applied.   DISPOSITION:  She was discharged home with her family.  Vitals are stable at  time of discharge.   DISCHARGE MEDICATIONS:  1. Coumadin 30 mg one tablet p.o.  2. Vasotec 20 mg daily.  3. Toprol 50 mg daily.  4. Plavix, hold until you finish Coumadin.  5. Nexium 40 mg daily.  6. Lipitor 80 mg at night.  7. Neurontin 300 mg at night.  8. Ultram 50 mg one tablet every 6-8 hours as needed.  9. Darvocet six to eight hours as needed.   DISCHARGE INSTRUCTIONS:  1. No drinking, no driving, use walker, no smoking.  2. No ibuprofen or Aleve while on Coumadin, resume Norvasc.  3. [facility] for PT, OT, R.N. to check PT/INR on Monday,     August 05, 2003.   FOLLOWUP:  1. With Dr. Elana Alm. Wainer in two weeks.  2. Follow up with Dr. Julius Bowels in four to six weeks.  Check blood pressures.  3  Follow up with Dr. Ranelle Oyster as needed.      Drucilla Schmidt, P.A.                         Ranelle Oyster, M.D.    LB/MEDQ  D:  08/04/2003  T:  08/06/2003  Job:  [id number]   cc:   Molly Maduro A. Thurston Hole, M.D.  [address]  [place] [address]  Fax: [phone]   Julius Bowels, Dr.   Ranelle Oyster, M.D.  [address] [place]  [place] [address]  Fax: [phone]

## 2011-04-22 ENCOUNTER — Ambulatory Visit: Payer: Medicare Other | Admitting: Gastroenterology

## 2011-04-29 ENCOUNTER — Ambulatory Visit: Payer: Medicare Other | Admitting: Gastroenterology

## 2011-05-03 ENCOUNTER — Encounter: Payer: Self-pay | Admitting: *Deleted

## 2011-05-04 ENCOUNTER — Ambulatory Visit (INDEPENDENT_AMBULATORY_CARE_PROVIDER_SITE_OTHER): Payer: Medicare Other | Admitting: Gastroenterology

## 2011-05-04 ENCOUNTER — Encounter: Payer: Self-pay | Admitting: Gastroenterology

## 2011-05-04 DIAGNOSIS — R1310 Dysphagia, unspecified: Secondary | ICD-10-CM | POA: Insufficient documentation

## 2011-05-04 DIAGNOSIS — Z9049 Acquired absence of other specified parts of digestive tract: Secondary | ICD-10-CM

## 2011-05-04 DIAGNOSIS — K589 Irritable bowel syndrome without diarrhea: Secondary | ICD-10-CM

## 2011-05-04 DIAGNOSIS — Z9889 Other specified postprocedural states: Secondary | ICD-10-CM | POA: Insufficient documentation

## 2011-05-04 DIAGNOSIS — Z9089 Acquired absence of other organs: Secondary | ICD-10-CM

## 2011-05-04 NOTE — Patient Instructions (Signed)
Your Barium Swallow is scheduled for 05/11/2011 at 11am please arrive at 10:45am [facility].  Increase your Colestid to two tablet by mouth twice a day, a new rx has been sent to your pharmacy.  Buy Metamucil OTC and take once a day.

## 2011-05-04 NOTE — Progress Notes (Signed)
This is a little demented 78 year old Caucasian female accompanied by her daughter. She has reported much positive GI review of systems today make in her evaluation very difficult. She can change to complain of diarrhea with thecal incontinence see a one hand, and the next minute his talk about constipation and disimpaction. She's had no rectal bleeding, significant abdominal pain, anorexia or weight loss. She's had extensive extensive GI workups which have otherwise been unremarkable except for IBS diarrhea and an element of bile salt diarrhea. She also complains of dysphagia for solids and liquids in her mid substernal area. She is status post fundoplication and had a negative endoscopic exam one year ago. She is on multiple medications listed and reviewed her chart and is followed at [facility].    Current Medications, Allergies, Past Medical History, Past Surgical History, Family History and Social History were reviewed in [employer] record.     Physical Exam: Awake and alert no acute distress appearing older than her stated age. I cannot appreciate stigmata of chronic liver disease. Chest is clear she appeared to be in a regular rhythm without murmurs gallops or rubs. Her abdomen is not distended and there is no organomegaly, masses or tenderness. Bowel sounds are normal. Inspection of the rectum is unremarkable as is rectal exam without masses or tenderness. There is formed stool present which is guaiac negative. Examination of the oropharynx and neck area is unremarkable. 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 I have ordered barium swallow for review. Her" diarrhea" it is hard to assess because of her dementia and multiple ,multiple complaints. She has had thorough GI workups in the past which have been unremarkable. I have increased her Colestid to 2 g by mouth twice  a day, have added daily Metamucil to her regime, and have asked her to continue her other medications as per primary care physicians. She is up-to-date on her endoscopies and colonoscopies. Previous evaluation for celiac disease was unremarkable.      Encounter Diagnosis  Name Primary?  Marland Kitchen Dysphagia Yes

## 2011-05-05 ENCOUNTER — Telehealth: Payer: Self-pay | Admitting: Gastroenterology

## 2011-05-05 MED ORDER — COLESTIPOL HCL 1 G PO TABS: ORAL_TABLET | ORAL | Status: DC

## 2011-05-05 NOTE — Telephone Encounter (Signed)
rx sent, kay aware

## 2011-05-11 ENCOUNTER — Ambulatory Visit (HOSPITAL_COMMUNITY)
Admission: RE | Admit: 2011-05-11 | Discharge: 2011-05-11 | Disposition: A | Discharge status: Home / Self Care | Payer: Medicare Other | Source: Ambulatory Visit | Attending: Gastroenterology | Admitting: Gastroenterology

## 2011-05-11 DIAGNOSIS — K224 Dyskinesia of esophagus: Secondary | ICD-10-CM | POA: Insufficient documentation

## 2011-05-11 DIAGNOSIS — K449 Diaphragmatic hernia without obstruction or gangrene: Secondary | ICD-10-CM | POA: Insufficient documentation

## 2011-05-11 DIAGNOSIS — R1310 Dysphagia, unspecified: Secondary | ICD-10-CM | POA: Insufficient documentation

## 2011-05-24 ENCOUNTER — Telehealth: Payer: Self-pay | Admitting: *Deleted

## 2011-05-24 NOTE — Telephone Encounter (Signed)
Message copied by Leonette Monarch on Mon May 24, 2011  1:39 PM ------      Message from: Jarold Motto, DAVID R      Created: Mon May 24, 2011 12:32 PM       Step 3 dysphagia diet and manometry

## 2011-05-26 NOTE — Telephone Encounter (Signed)
Dr Jarold Motto is not built in Epic I have put in a ticket to have this corrected until it is corrected we can not order the mano, I have called the daughter to advise her but there is no answer. I will call back later today. I went ahead and mailed the dysphagia diet.

## 2011-05-31 ENCOUNTER — Encounter (HOSPITAL_COMMUNITY)
Admission: RE | Disposition: A | Discharge status: Home / Self Care | Payer: Self-pay | Source: Ambulatory Visit | Attending: Gastroenterology

## 2011-05-31 ENCOUNTER — Ambulatory Visit (HOSPITAL_COMMUNITY)
Admission: RE | Admit: 2011-05-31 | Discharge: 2011-05-31 | Disposition: A | Discharge status: Home / Self Care | Payer: Medicare Other | Source: Ambulatory Visit | Attending: Gastroenterology | Admitting: Gastroenterology

## 2011-05-31 ENCOUNTER — Encounter (INDEPENDENT_AMBULATORY_CARE_PROVIDER_SITE_OTHER): Payer: Medicare Other | Admitting: Gastroenterology

## 2011-05-31 ENCOUNTER — Encounter (HOSPITAL_COMMUNITY): Payer: Self-pay | Admitting: *Deleted

## 2011-05-31 DIAGNOSIS — R1310 Dysphagia, unspecified: Secondary | ICD-10-CM | POA: Insufficient documentation

## 2011-05-31 HISTORY — PX: ESOPHAGEAL MANOMETRY: SHX5429

## 2011-05-31 SURGERY — MANOMETRY, ESOPHAGUS
Anesthesia: Choice

## 2011-05-31 MED ORDER — LIDOCAINE VISCOUS 2 % MT SOLN
2.5000 mL | OROMUCOSAL | Status: DC
  Filled 2011-05-31: qty 5

## 2011-05-31 NOTE — Telephone Encounter (Signed)
pts daughter aware and they are on their way.

## 2011-06-01 ENCOUNTER — Encounter (HOSPITAL_COMMUNITY): Payer: Self-pay | Admitting: Gastroenterology

## 2011-06-01 ENCOUNTER — Encounter (HOSPITAL_COMMUNITY): Payer: Self-pay

## 2011-06-04 NOTE — Progress Notes (Signed)
Esophageal manometry was completed on 05/31/2011. Levels are as follows:  #1 Upper Esophageal Sphincter-there is normal coordination between pharyngeal contraction and cricopharyngeal relaxation.  #2 Lower Esophageal Sphincter-LES pressures normal at 27 with 100% relaxation with swallowing.  #3 Motility Pattern-there are normally propagated peristaltic waves throughout the esophagus to wet and dry swallows. Mean amplitude of contractions is 83 mmHg and 100% peristalsis.  Assessment: This is a normal esophageal manometry without any changes to explain the patient's dysphagia .Marland KitchenMarland KitchenMarland Kitchen

## 2011-06-07 ENCOUNTER — Encounter: Payer: Self-pay | Admitting: *Deleted

## 2011-06-07 ENCOUNTER — Telehealth: Payer: Self-pay | Admitting: *Deleted

## 2011-06-07 NOTE — Telephone Encounter (Signed)
Per Dr Arlie Solomons normal pt needs egd with dilation. I will arrange this with her daughter. I have tried to contact her daughter but her voicemail is full

## 2011-06-07 NOTE — Telephone Encounter (Signed)
previsit on 06/09/2011 and egd on 06/11/2011

## 2011-06-08 ENCOUNTER — Encounter: Payer: Self-pay | Admitting: Gastroenterology

## 2011-06-09 ENCOUNTER — Ambulatory Visit (AMBULATORY_SURGERY_CENTER): Payer: Medicare Other | Admitting: *Deleted

## 2011-06-09 DIAGNOSIS — R1310 Dysphagia, unspecified: Secondary | ICD-10-CM

## 2011-06-11 ENCOUNTER — Ambulatory Visit (AMBULATORY_SURGERY_CENTER): Payer: Medicare Other | Admitting: Gastroenterology

## 2011-06-11 ENCOUNTER — Encounter: Payer: Self-pay | Admitting: Gastroenterology

## 2011-06-11 VITALS — BP 178/72 | HR 63 | Temp 96.9°F | Resp 20 | Ht 62.0 in | Wt 128.0 lb

## 2011-06-11 DIAGNOSIS — K222 Esophageal obstruction: Secondary | ICD-10-CM | POA: Insufficient documentation

## 2011-06-11 DIAGNOSIS — R1310 Dysphagia, unspecified: Secondary | ICD-10-CM

## 2011-06-11 DIAGNOSIS — D649 Anemia, unspecified: Secondary | ICD-10-CM

## 2011-06-11 DIAGNOSIS — K219 Gastro-esophageal reflux disease without esophagitis: Secondary | ICD-10-CM

## 2011-06-11 DIAGNOSIS — K3184 Gastroparesis: Secondary | ICD-10-CM

## 2011-06-11 MED ORDER — SODIUM CHLORIDE 0.9 % IV SOLN: 500.0000 mL | INTRAVENOUS | Status: DC

## 2011-06-11 NOTE — Progress Notes (Signed)
Patient did not experience any of the following events: a burn prior to discharge; a fall within the facility; wrong site/side/patient/procedure/implant event; or a hospital transfer or hospital admission upon discharge from the facility. (G8907) Patient did not have preoperative order for IV antibiotic SSI prophylaxis. (G8918)  

## 2011-06-11 NOTE — Patient Instructions (Signed)
FOLLOW DILATATION DIET GIVEN TO YOU TODAY. LIQUIDS ONLY X 1 HOUR THEN SOFT FOODS THE REST OF TODAY.  YOU HAD AN ENDOSCOPIC PROCEDURE TODAY AT THE [facility]: Refer to the procedure report that was given to you for any specific questions about what was found during the examination.  If the procedure report does not answer your questions, please call your gastroenterologist to clarify.  If you requested that your care partner not be given the details of your procedure findings, then the procedure report has been included in a sealed envelope for you to review at your convenience later.  YOU SHOULD EXPECT: Some feelings of bloating in the abdomen. Passage of more gas than usual.  Walking can help get rid of the air that was put into your GI tract during the procedure and reduce the bloating. If you had a lower endoscopy (such as a colonoscopy or flexible sigmoidoscopy) you may notice spotting of blood in your stool or on the toilet paper. If you underwent a bowel prep for your procedure, then you may not have a normal bowel movement for a few days.  DIET: Your first meal following the procedure should be a light meal and then it is ok to progress to your normal diet.  A half-sandwich or bowl of soup is an example of a good first meal.  Heavy or fried foods are harder to digest and may make you feel nauseous or bloated.  Likewise meals heavy in dairy and vegetables can cause extra gas to form and this can also increase the bloating.  Drink plenty of fluids but you should avoid alcoholic beverages for 24 hours.  ACTIVITY: Your care partner should take you home directly after the procedure.  You should plan to take it easy, moving slowly for the rest of the day.  You can resume normal activity the day after the procedure however you should NOT DRIVE or use heavy machinery for 24 hours (because of the sedation medicines used during the test).    SYMPTOMS TO REPORT IMMEDIATELY: A gastroenterologist  can be reached at any hour.  During normal business hours, 8:30 AM to 5:00 PM Monday through Friday, call [phone].  After hours and on weekends, please call the GI answering service at [phone] who will take a message and have the physician on call contact you.   Following lower endoscopy (colonoscopy or flexible sigmoidoscopy):  Excessive amounts of blood in the stool  Significant tenderness or worsening of abdominal pains  Swelling of the abdomen that is new, acute  Fever of 100F or higher  Following upper endoscopy (EGD)  Vomiting of blood or coffee ground material  New chest pain or pain under the shoulder blades  Painful or persistently difficult swallowing  New shortness of breath  Fever of 100F or higher  Black, tarry-looking stools  FOLLOW UP: If any biopsies were taken you will be contacted by phone or by letter within the next 1-3 weeks.  Call your gastroenterologist if you have not heard about the biopsies in 3 weeks.  Our staff will call the home number listed on your records the next business day following your procedure to check on you and address any questions or concerns that you may have at that time regarding the information given to you following your procedure. This is a courtesy call and so if there is no answer at the home number and we have not heard from you through the emergency physician on call,  we will assume that you have returned to your regular daily activities without incident.  SIGNATURES/CONFIDENTIALITY: You and/or your care partner have signed paperwork which will be entered into your electronic medical record.  These signatures attest to the fact that that the information above on your After Visit Summary has been reviewed and is understood.  Full responsibility of the confidentiality of this discharge information lies with you and/or your care-partner.     CONTINUE ANTI REFLUX MEDICATION

## 2011-06-11 NOTE — Op Note (Signed)
[facility] [address] [place], [place]  [address]  ENDOSCOPY PROCEDURE REPORT  PATIENT:  [name], [name]  MR#:  [id number] BIRTHDATE:  [birth year], 89 yrs. old  GENDER:  female  ENDOSCOPIST:  Vania Rea. Jarold Motto, MD, [facility] Referred by:  PROCEDURE DATE:  06/11/2011 PROCEDURE:  EGD, diagnostic 43235, Maloney Dilation of Esophagus ASA CLASS:  Class II INDICATIONS:  dysphagia NORMAL ESOPHAGEAL MANOMETRY  MEDICATIONS:   propofol (Diprivan) 90 mg IV TOPICAL ANESTHETIC:  DESCRIPTION OF PROCEDURE:   After the risks and benefits of the procedure were explained, informed consent was obtained.  The LB GIF-H180 [id number] endoscope was introduced through the mouth and advanced to the second portion of the duodenum.  The instrument was slowly withdrawn as the mucosa was fully examined. <<PROCEDUREIMAGES>>  Retained food was present. see pictures.  Normal duodenal folds were noted.  The esophagus and gastroesophageal junction were completely normal in appearance. LARGE HH WITH STRICTURE AT GE JUNCTION DILATED WITH #33F MALONEY DILATOR.NOHEME BUT SOME RESISTANCE.  The esophagus and gastroesophageal junction were completely normal in appearance.    RETAINED FOOD.HARD TO SEE FUNDUS AREA.  The scope was then withdrawn from the patient and the procedure completed.  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 RECOMMENDATIONS: 1) Clear liquids for 6 hours, call for chest pain. Soft diet today, resume regular diet tomorrow. 2) continue PPI MAY NEED PROKINETIC RX.  ______________________________ Vania Rea. Jarold Motto, MD, Clement[facility] Graham  CC:  Albertina Senegal MD  n. Rosalie DoctorVania Rea. Jenasis Straley at 06/11/2011 01:50 PM  Storm Frisk, [id number]

## 2011-06-14 ENCOUNTER — Telehealth: Payer: Self-pay | Admitting: *Deleted

## 2011-06-14 NOTE — Telephone Encounter (Signed)
  Follow up Call-  Call back number 06/11/2011  Post procedure Call Back phone  # [phone]  Permission to leave phone message Yes     Patient questions:  Do you have a fever, pain , or abdominal swelling? no Pain Score  0 *  Have you tolerated food without any problems? yes  Have you been able to return to your normal activities? yes  Do you have any questions about your discharge instructions: Diet   no Medications  no Follow up visit  no  Do you have questions or concerns about your Care? no  Actions: * If pain score is 4 or above: No action needed, pain <4. Pt is not having any pain or problems related to endoscopy on Friday, but pt. Says she is having muscle tightness in neck ,back,shoulders behind knees, and legs. Pt says she went to [facility] ER last night this muscle tightness & pain got so bad. Pt. Says she does not feel this is related at all to endo. Procedure because she has had this happen before. Pt. Says ER gave her pain med. & she is to see her regular Dr., Dr. Thornton Park this week. Pt denies any throat or chest pain or trouble swallowing during all of this.

## 2011-10-14 ENCOUNTER — Telehealth: Payer: Self-pay | Admitting: Gastroenterology

## 2011-10-14 NOTE — Telephone Encounter (Signed)
Joyce Gross at Dr Kandis Cocking ofc states he wants pt seen for anemia. In February, h&h was 11.7 and 32.9 and recent labs were 10.8 and 32.9. Explained that Dr Jarold Motto doesn't have anything and pt can see a Mid Level next week if ok. Joyce Gross asks that I go thru her with the appt; will call when next week's schedule is out.

## 2011-10-15 NOTE — Telephone Encounter (Signed)
Informed [name] that pt is scheduled to see Willette Cluster, NP on 10/19/11 at 10am; [name] will inform the pt.

## 2011-10-19 ENCOUNTER — Ambulatory Visit (INDEPENDENT_AMBULATORY_CARE_PROVIDER_SITE_OTHER): Payer: Medicare Other | Admitting: Nurse Practitioner

## 2011-10-19 ENCOUNTER — Encounter: Payer: Self-pay | Admitting: Nurse Practitioner

## 2011-10-19 ENCOUNTER — Other Ambulatory Visit (INDEPENDENT_AMBULATORY_CARE_PROVIDER_SITE_OTHER): Payer: Medicare Other

## 2011-10-19 VITALS — BP 148/80 | HR 82 | Ht 61.0 in | Wt 131.6 lb

## 2011-10-19 DIAGNOSIS — D649 Anemia, unspecified: Secondary | ICD-10-CM | POA: Insufficient documentation

## 2011-10-19 DIAGNOSIS — K909 Intestinal malabsorption, unspecified: Secondary | ICD-10-CM

## 2011-10-19 DIAGNOSIS — K9089 Other intestinal malabsorption: Secondary | ICD-10-CM

## 2011-10-19 LAB — VITAMIN B12: Vitamin B-12: 232 pg/mL (ref 211–911)

## 2011-10-19 LAB — CBC WITH DIFFERENTIAL/PLATELET
Basophils Relative: 0.6 % (ref 0.0–3.0)
Eosinophils Relative: 3.9 % (ref 0.0–5.0)
MCV: 91.7 fl (ref 78.0–100.0)
Monocytes Absolute: 0.3 10*3/uL (ref 0.1–1.0)
Monocytes Relative: 7.2 % (ref 3.0–12.0)
Neutrophils Relative %: 60 % (ref 43.0–77.0)
Platelets: 168 10*3/uL (ref 150.0–400.0)
RBC: 3.72 Mil/uL — ABNORMAL LOW (ref 3.87–5.11)
WBC: 4.5 10*3/uL (ref 4.5–10.5)

## 2011-10-19 LAB — FOLATE: Folate: >24.8 ng/mL (ref >5.9)

## 2011-10-19 LAB — FERRITIN: Ferritin: 29.2 ng/mL (ref 10.0–291.0)

## 2011-10-19 LAB — IBC PANEL
Iron: 46 ug/dL (ref 42–145)
Transferrin: 289.8 mg/dL (ref 212.0–360.0)

## 2011-10-19 NOTE — Patient Instructions (Addendum)
Please go to the basement level to have your labs drawn.  Restart Colestid medication.

## 2011-10-19 NOTE — Progress Notes (Signed)
[name] [id number] 1921/11/09   HISTORY OR PRESENT ILLNESS :  Patient is a 78 year old female known to Dr. Jarold Motto for history of dysphasia and chronic diarrhea felt to be related to combination of IBS, lactose intolerance, and bile salt malabsorption. Patient referred here by cardiologist for evaluation of anemia. She was started on iron last week. She denies bowel changes, still has chronic diarrhea. No overt GI bleeding. No black stools. No abdominal pain.   Current Medications, Allergies, Past Medical History, Past Surgical History, Family History and Social History were reviewed in [employer] record.   PHYSICAL EXAMINATION : General:  Well developed  female in no acute distress Head: Normocephalic and atraumatic Eyes:  sclerae anicteric,conjunctive pink. Lungs: Clear throughout to auscultation Heart: Regular rate and rhythm Abdomen: Soft, nondistended, nontender. No masses or hepatomegaly noted. Normal bowel sounds Rectal: brown, heme negative stool Musculoskeletal: Symmetrical with no gross deformities  Skin: No lesions on visible extremities Extremities: No edema or deformities noted Cervical Nodes:  No significant cervical adenopathy Psychological:  Alert and cooperative. Normal mood and affect  ASSESSMENT AND PLAN : 1. Normocytic anemia, drop in hemoglobin from 11.7 in February to 10.8 on  10/12/11. No overt bleeding. Stools dark green, Hemoccult negative in the office. Will check ferritin and TIBC keeping in mind the patient has recently begun iron. Will also obtain a folate level. We have requested the serum B12 level results drawn by cardiology last week. Recommend patient continue oral iron for now. I will call her with the lab results and further recommendations. She is up to date on colonoscopy. Patient has no GI complaints other than chronic diarrhea. At this point there is no need for endoscopy (assuming labs are okay).  Will call patient  with rsults.  2. Chronic diarrhea with element of bile salt malabsorption.  Not on Colestid as we recommended for unclear reasons. Recommend restarting the Colestid.

## 2011-10-20 ENCOUNTER — Encounter: Payer: Self-pay | Admitting: Nurse Practitioner

## 2011-10-25 NOTE — Progress Notes (Signed)
I AGREE WITH ASSEESMENT AND PLANS

## 2011-10-27 ENCOUNTER — Telehealth: Payer: Self-pay | Admitting: Nurse Practitioner

## 2011-10-27 NOTE — Telephone Encounter (Signed)
Pts dai

## 2011-10-27 NOTE — Telephone Encounter (Signed)
Pts daughter called asking about pts lab results. Let her know pts labs and comments per Willette Cluster NP. Daughter states that Dr. Alanda Amass wanted to find out if Dr. Jarold Motto thought the pt needed Vit B12 injections again. Daughter states that pt has no energy and sleeps all the time. Daughter wanted to know if B 12 injections might help. Please advise.

## 2011-10-27 NOTE — Telephone Encounter (Signed)
 SHOT IS OK...i DOUBT THIS CAUSES DROWZINESS.Marland KitchenMarland KitchenMarland Kitchen

## 2011-11-02 NOTE — Telephone Encounter (Signed)
Daughter states she will talk to her mother and see if her mother wants to get the B12 again. States she will call back to schedule if her mother wants to come for the injections.

## 2012-07-27 ENCOUNTER — Encounter: Payer: Self-pay | Admitting: Gastroenterology

## 2012-07-27 ENCOUNTER — Ambulatory Visit (INDEPENDENT_AMBULATORY_CARE_PROVIDER_SITE_OTHER): Payer: Medicare Other | Admitting: Gastroenterology

## 2012-07-27 VITALS — BP 140/66 | HR 68 | Ht 58.66 in | Wt 130.2 lb

## 2012-07-27 DIAGNOSIS — K589 Irritable bowel syndrome without diarrhea: Secondary | ICD-10-CM

## 2012-07-27 DIAGNOSIS — K30 Functional dyspepsia: Secondary | ICD-10-CM

## 2012-07-27 DIAGNOSIS — K3189 Other diseases of stomach and duodenum: Secondary | ICD-10-CM

## 2012-07-27 DIAGNOSIS — Z9889 Other specified postprocedural states: Secondary | ICD-10-CM

## 2012-07-27 DIAGNOSIS — R413 Other amnesia: Secondary | ICD-10-CM

## 2012-07-27 DIAGNOSIS — R197 Diarrhea, unspecified: Secondary | ICD-10-CM

## 2012-07-27 DIAGNOSIS — Z9089 Acquired absence of other organs: Secondary | ICD-10-CM

## 2012-07-27 DIAGNOSIS — Z9049 Acquired absence of other specified parts of digestive tract: Secondary | ICD-10-CM

## 2012-07-27 MED ORDER — LOPERAMIDE HCL 2 MG PO TABS: 2.0000 mg | ORAL_TABLET | ORAL | Status: AC

## 2012-07-27 NOTE — Progress Notes (Signed)
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  She has an associated esophageal motility disturbance Ms. had previous fundoplication surgery, chronic diarrhea, all exacerbated by cholecystectomy.  She is refused over the years to take Questran or Colestid for her bile salt enteropathy.  She's had previous extensive GI workup including small bowel and colon biopsies which are been normal.  She continues to complain of" diarrhea" which are soft loose stools with some incontinency without any specific food intolerances, but with no anorexia or weight loss.  She is on multiple medications including daily Plavix.  She has small vessel dementia, and her is with her throughout the interview and exam.  There is no history of melena, hematochezia, and she is up-to-date on her endoscopy and colonoscopy.  Previous esophageal manometry one year ago was normal.  She's been treated in the past for broad-spectrum antibiotics for diarrhea without improvement.  There is no history of pancreatitis or hepatitis.  She denies abdominal pain, fever, chills, or systemic complaints.  I have reviewed this patient's present history, medical and surgical past history, allergies and medications.     ROS:   All systems were reviewed and are negative unless otherwise stated in the HPI.    Physical Exam: General well developed well nourished patient in no acute distress, appearing their stated age Eyes PERRLA, no icterus, fundoscopic exam per opthamologist Skin no lesions noted Neck supple, no adenopathy, no thyroid enlargement, no tenderness Chest clear to percussion and auscultation Heart no significant murmurs, gallops or rubs noted Abdomen no hepatosplenomegaly masses or tenderness, BS normal.  Rectal inspection normal no fissures, or fistulae noted.  No masses or tenderness on digital exam. Stool  guaiac negative. Extremities no acute joint lesions, edema, phlebitis or evidence of cellulitis. Neurologic patient oriented x 3, cranial nerves intact, no focal neurologic deficits noted. Psychological mental status normal and normal affect.  Assessment and plan: Chronic IBS diarrhea exacerbated by cholecystectomy with refusal on the patient's part to take bilel salt solvents.  She has obvious dementia and I am not inclined for a long and involved GI workup at this point at age 44.  She relates that she takes Imodium her symptoms are pretty much in check.  I've recommended Imodium every other day as tolerated while continuing her long list of other medications.  She is afraid she will become constipated on this regime.   No diagnosis found.

## 2012-07-27 NOTE — Patient Instructions (Signed)
  Please take Imodium by mouth every other day and if that does not help the diarrhea please call office back. _________________________________________________________________________________________________________________________________                                               We are excited to introduce MyChart, a new best-in-class service that provides you online access to important information in your electronic medical record. We want to make it easier for you to view your health information - all in one secure location - when and where you need it. We expect MyChart will enhance the quality of care and service we provide.  When you register for MyChart, you can:    View your test results.    Request appointments and receive appointment reminders via email.    Request medication renewals.    View your medical history, allergies, medications and immunizations.    Communicate with your physician's office through a password-protected site.    Conveniently print information such as your medication lists.  To find out if MyChart is right for you, please talk to a member of our clinical staff today. We will gladly answer your questions about this free health and wellness tool.  If you are age 49 or older and want a member of your family to have access to your record, you must provide written consent by completing a proxy form available at our office. Please speak to our clinical staff about guidelines regarding accounts for patients younger than age 62.  As you activate your MyChart account and need any technical assistance, please call the MyChart technical support line at [phone] [phone]) or email your question to [email or web address]. If you email your question(s), please include your name, a return phone number and the best time to reach you.  If you have non-urgent health-related questions, you can send a message to our office through MyChart at  [email or web address]. If you have a medical emergency, call 911.  Thank you for using MyChart as your new health and wellness resource!   MyChart licensed from [employer],  4540-9811. Patents Pending.

## 2012-09-04 ENCOUNTER — Other Ambulatory Visit: Payer: Self-pay | Admitting: *Deleted

## 2012-09-06 ENCOUNTER — Encounter: Payer: Self-pay | Admitting: Cardiovascular Disease

## 2012-09-12 ENCOUNTER — Encounter: Payer: Self-pay | Admitting: Gastroenterology

## 2012-09-12 ENCOUNTER — Ambulatory Visit (INDEPENDENT_AMBULATORY_CARE_PROVIDER_SITE_OTHER): Payer: Medicare Other | Admitting: Gastroenterology

## 2012-09-12 VITALS — BP 130/60 | HR 61 | Ht <= 58 in | Wt 128.2 lb

## 2012-09-12 DIAGNOSIS — F0391 Unspecified dementia with behavioral disturbance: Secondary | ICD-10-CM

## 2012-09-12 DIAGNOSIS — R197 Diarrhea, unspecified: Secondary | ICD-10-CM

## 2012-09-12 DIAGNOSIS — K529 Noninfective gastroenteritis and colitis, unspecified: Secondary | ICD-10-CM

## 2012-09-12 MED ORDER — COLESTIPOL HCL 1 G PO TABS: ORAL_TABLET | ORAL | Status: DC

## 2012-09-12 NOTE — Patient Instructions (Addendum)
Stop Protonix. Start Colestid starting in the middle of the morning and twice daily. Follow up with Dr. Jarold Motto in one month.

## 2012-09-12 NOTE — Progress Notes (Signed)
This is a 79 year old Caucasian female with chronic advancing dementia accompanied by her daughter as usual.  She continues to complain of watery diarrhea without abdominal pain or other GI complaints.  She is status post fundoplication and cholecystectomy.  Despite these complaints she's had no anorexia, weight loss, or any other systemic complaints.  Previous GI workups have all been negative.  She's been prescribed Colestid on several occasions but is refused to take his medications for reasons which are unclear.  She currently uses twice a day Imodium.  There is no history of hepatitis, pancreatitis, but she is on Protonix for several years.  Current Medications, Allergies, Past Medical History, Past Surgical History, Family History and Social History were reviewed in [employer] record.  ROS: All systems were reviewed and are negative unless otherwise stated in the HPI.          Physical Exam: Healthy-appearing patient is obviously demented but oriented to time and place.  Blood pressure 130/60, pulse 61 and weight 128 with a BMI of 26.81.  I cannot appreciate stigmata of chronic liver disease.  Her abdomen shows no organomegaly, masses, tenderness, or distention.  Bowel sounds are normal.  Rectal exam shows a paced he semi-solid stool  yellow in color and guaiac-negative.    Assessment and Plan:"Diarrhea" of unexplained etiology which is largely functional in nature.  I have urged her again to use Colestid 1 g in mid morning and twice a day if needed, when necessary Imodium and discontinue Protonix.  I think a lot of her perception of diarrhea is distorted by her dementia.  If she does not improve above regime, referred her to a tertiary Medical Center for other possible evaluations she has a history of hypertensive cardiovascular disease, but her symptoms certainly do not seem consistent with ischemic bowel problems or chronic malabsorption. No diagnosis found.

## 2012-09-12 NOTE — Addendum Note (Signed)
Addended by: Ok Anis A on: 09/12/2012 03:18 PM   Modules accepted: Orders, Medications

## 2012-09-14 ENCOUNTER — Telehealth: Payer: Self-pay | Admitting: Cardiovascular Disease

## 2012-10-16 ENCOUNTER — Telehealth: Payer: Self-pay | Admitting: Gastroenterology

## 2012-10-18 NOTE — Telephone Encounter (Signed)
Assessment and Plan:"Diarrhea" of unexplained etiology which is largely functional in nature. I have urged her again to use Colestid 1 g in mid morning and twice a day if needed, when necessary Imodium and discontinue Protonix. I think a lot of her perception of diarrhea is distorted by her dementia. If she does not improve above regime, referred her to a tertiary Medical Center for other possible evaluations she has a history of hypertensive cardiovascular disease, but her symptoms certainly do not seem consistent with ischemic bowel problems or chronic malabsorption.  No diagnosis found. Pt's neighbor called to report pt's diarrhea is no better. She states the pt states she tried the Colestid and it did not help; she continues to take Imodium. Neighbor informed me pt's brother died last week which has pt all upset. Neighbor brought pt on 09/12/12 visit and had a reminder in her phone to call us with an update; she knows pt has dementia, but pt wanted [place] to call her. Please advise. Thanks.

## 2012-10-19 NOTE — Telephone Encounter (Signed)
[name] needs a i care doc...Marland KitchenMarland Kitchen

## 2012-10-19 NOTE — Telephone Encounter (Signed)
Informed [name] Dr Jarold Motto states pt needs to f/u with her PCP; pt stated understanding.

## 2012-11-22 ENCOUNTER — Telehealth: Payer: Self-pay | Admitting: Cardiovascular Disease

## 2012-11-22 NOTE — Telephone Encounter (Signed)
Her Bp was 89/48 and pulse rate was 56-Called her medical doctor and they her to contact Dr Kandis Cocking office. Checked it later it was 99/56 pulse rate 65.They said she might be dehydrated.She have been dizzy and eyes look like they are sunk back in her head.

## 2012-11-22 NOTE — Telephone Encounter (Signed)
Returned call and spoke w/ Joyce Gross, pt's daughter.  Stated pt has been dizzy and can't keep her eyes open.  Stated her BP was 86/48 pulse 56.  Stated about 45 mins later it was 99/56 pulse 56.  Stated she called the medical doctor b/c pt was seen last week and they said pt's kidneys were dry.  Stated they told pt to increase water and gave her Magnesium for the cramps in her hands.  Stated pt was also started on Aricept and had nausea and eyes sunken after taking x 3 days so they stopped it.  Stated Dr. Alanda Amass has pt on 40 mg Lasix and the doctor was aware, but didn't tell pt to stop it.  Joyce Gross wanted to know if Lasix should be decreased or not.  Kay informed Dr. Alanda Amass is out of the office today and will be notified tomorrow.  Advised to hold Lasix in morning until a response from Dr. Alanda Amass or JC, LPN.  Verbalized understanding and agreed w/ plan.  Message forwarded to [facility]. Berlinda Last, LPN to discuss w/ Dr. Alanda Amass.  This note and paper chart# [id number] placed on Dr. Kandis Cocking cart.

## 2013-01-31 ENCOUNTER — Other Ambulatory Visit: Payer: Self-pay | Admitting: *Deleted

## 2013-01-31 MED ORDER — LOSARTAN POTASSIUM 100 MG PO TABS: 150.0000 mg | ORAL_TABLET | Freq: Every day | ORAL | Status: DC

## 2013-02-23 ENCOUNTER — Ambulatory Visit: Payer: Medicare Other | Admitting: Cardiovascular Disease

## 2013-02-27 ENCOUNTER — Encounter: Payer: Self-pay | Admitting: Cardiovascular Disease

## 2013-02-27 ENCOUNTER — Ambulatory Visit (INDEPENDENT_AMBULATORY_CARE_PROVIDER_SITE_OTHER): Payer: Medicare Other | Admitting: Cardiovascular Disease

## 2013-02-27 VITALS — BP 160/79 | HR 74 | Ht <= 58 in | Wt 123.6 lb

## 2013-02-27 DIAGNOSIS — I2510 Atherosclerotic heart disease of native coronary artery without angina pectoris: Secondary | ICD-10-CM

## 2013-02-27 DIAGNOSIS — I10 Essential (primary) hypertension: Secondary | ICD-10-CM

## 2013-02-27 DIAGNOSIS — I739 Peripheral vascular disease, unspecified: Secondary | ICD-10-CM

## 2013-02-27 DIAGNOSIS — E039 Hypothyroidism, unspecified: Secondary | ICD-10-CM

## 2013-02-27 DIAGNOSIS — E782 Mixed hyperlipidemia: Secondary | ICD-10-CM

## 2013-02-27 DIAGNOSIS — R42 Dizziness and giddiness: Secondary | ICD-10-CM

## 2013-02-27 DIAGNOSIS — R55 Syncope and collapse: Secondary | ICD-10-CM

## 2013-02-27 DIAGNOSIS — Z79899 Other long term (current) drug therapy: Secondary | ICD-10-CM

## 2013-02-27 DIAGNOSIS — E785 Hyperlipidemia, unspecified: Secondary | ICD-10-CM

## 2013-02-27 DIAGNOSIS — K449 Diaphragmatic hernia without obstruction or gangrene: Secondary | ICD-10-CM

## 2013-02-27 NOTE — Patient Instructions (Addendum)
Your physician has recommended that you wear an event monitor. Event monitors are medical devices that record the heart's electrical activity. Doctors most often [place] these monitors to diagnose arrhythmias. Arrhythmias are problems with the speed or rhythm of the heartbeat. The monitor is a small, portable device. You can wear one while you do your normal daily activities. This is usually used to diagnose what is causing palpitations/syncope (passing out).  Your physician recommends that you return for lab work fasting. Your physician has requested that you have a lexiscan myoview. For further information please visit [email or web address] Please follow instruction sheet, as given.  Your physician has recommended you make the following change in your medication: on days that you notice increased swelling, you can take the furosemide 40 mg in the morning and 20 mg in the PM.    Your physician recommends that you schedule a follow-up appointment in: 6 WEEKS.

## 2013-02-28 ENCOUNTER — Telehealth: Payer: Self-pay | Admitting: Cardiovascular Disease

## 2013-02-28 NOTE — Telephone Encounter (Signed)
lmsg for pt to call and schedule stress test and 6 week f/u appt with tk.,

## 2013-03-14 ENCOUNTER — Telehealth: Payer: Self-pay | Admitting: Cardiovascular Disease

## 2013-03-14 NOTE — Telephone Encounter (Signed)
sw patient - she wants to wait until she has finished wearing the monitor to schedule lexiscan.  Appt needs to be made with her daughter Joyce Gross - @ [phone].

## 2013-03-18 ENCOUNTER — Encounter: Payer: Self-pay | Admitting: Cardiovascular Disease

## 2013-03-18 NOTE — Progress Notes (Signed)
Patient ID: RAELYNN CORRON, female   DOB: 08/13/1921, 79 y.o.   MRN: [id number]     PATIENT PROFILE: Ms. Tarissa Kerin is an 79 year female who presents to the office today to establish cardiological care with me. She is a former patient of Dr. Alanda Amass who last saw him in his solo practice in May 2014.   HPI: Ms. Suzette Battiest has a history of  labile hypertension. She has established coronary artery disease and states that she suffered a myocardial infarction in 1993 and underwent CABG revascularization surgery at that time. She underwent stenting of her right coronary artery in 2005 by Dr. Elsie Lincoln and a Cypher DES stent was inserted and had a patent LIMA to the LAD and vein graft to a diagonal vessel. She also has a history of prior hiatal hernia surgery and underwent Nissen procedure. She has had achalasia-type symptoms in the past stricture and has been under the care of Dr. Jarold Motto. She also has undergone esophageal dilatation of her stricture. She has documented moderate bilateral carotid stenoses are her last Doppler study was done at [facility] radiology in August 2013 and showed moderate calcified and noncalcified plaque in both carotids with a 50-69% left carotid artery stenosis and less than 50% right internal carotid artery stenosis. She did agree vertebral flow. She has had difficulty with labile hypertension. At times she has been demonstrated to have bradycardia. She has noticed recent diarrhea and weakness. She also had an episode of apparent presyncope week ago. She also admits to intermittent leg swelling. She presents now to establish care with me.  Past Medical History  Diagnosis Date  . Gastroparesis   . Unspecified gastritis and gastroduodenitis without mention of hemorrhage   . Flatulence, eructation, and gas pain   . Dysphagia, unspecified(787.20)   . Peripheral vascular disease, unspecified   . Other and unspecified hyperlipidemia   . Esophageal reflux   . Unspecified  hypothyroidism   . Coronary atherosclerosis of unspecified type of vessel, native or graft   . Unspecified essential hypertension   . Acute venous embolism and thrombosis of unspecified deep vessels of lower extremity   . Diverticulosis of colon (without mention of hemorrhage)   . Diarrhea   . Vitamin B12 deficiency   . Anemia, unspecified   . Colitis   . Bile salt-induced diarrhea   . Dementia   . Dysphagia     Additional past medical history is notable that she states she had brain tumor surgery 1954.  Past Surgical History  Procedure Laterality Date  . Nissen fundoplication  07/2004  . Cholecystectomy    . Esophageal manometry  05/31/2011    Procedure: ESOPHAGEAL MANOMETRY (EM);  Surgeon: Sheryn Bison, MD;  Location: [facility] ENDOSCOPY;  Service: Endoscopy;  Laterality: N/A;  . Upper gastrointestinal endoscopy      Allergies  Allergen Reactions  . Codeine   . Iohexol      Code: RASH, Desc: LEISHA STATES PT IS ALLERGIC TO IV DYE 07/04/07/RM, Onset Date: [id number]   . Latex   . Morphine And Related     Current Outpatient Prescriptions  Medication Sig Dispense Refill  . amLODipine (NORVASC) 10 MG tablet Take 10 mg by mouth daily.        Marland Kitchen atorvastatin (LIPITOR) 20 MG tablet Take 20 mg by mouth daily.        . clopidogrel (PLAVIX) 75 MG tablet Take 75 mg by mouth daily.      . colestipol (COLESTID) 1 G tablet Take  0.5 g by mouth every other day.      . donepezil (ARICEPT) 5 MG tablet Take 5 mg by mouth at bedtime.      . folic acid (FOLVITE) 1 MG tablet Take 1 mg by mouth daily.      . furosemide (LASIX) 40 MG tablet Take 40 mg by mouth daily.      Marland Kitchen levothyroxine (SYNTHROID, LEVOTHROID) 50 MCG tablet Take 50 mcg by mouth daily.        Marland Kitchen loperamide (IMODIUM A-D) 2 MG tablet Take 1 tablet (2 mg total) by mouth as directed.  30 tablet  0  . losartan (COZAAR) 100 MG tablet Take 1.5 tablets (150 mg total) by mouth daily.  45 tablet  4  . MAGNESIUM PO Take 1 tablet by mouth daily.       . meclizine (ANTIVERT) 25 MG tablet Take 25 mg by mouth as needed.        . potassium chloride (KLOR-CON) 10 MEQ CR tablet Take 10 mEq by mouth daily.        . Pediatric Multivit-Minerals-C (CHILDRENS MULTIVITAMIN PO) Take 1 tablet by mouth daily.       No current facility-administered medications for this visit.    Social history is notable in that she is widowed. Has one child and 2 grandchildren. She quit smoking 55 years ago. There is no alcohol use.  Family History  Problem Relation Age of Onset  . Heart disease Mother   . Colon cancer Neg Hx   . Heart disease Brother     ROS is negative for fever chills or night sweats. She denies skin rash. She denies recent visual changes. She denies recent change in hearing. She has had history of pneumonia in the past. She status post prior Nissen fundoplication for severe hiatal hernia with achalasia-type symptoms and is status post esophageal dilatations. She has a history of hypertension. She denies PND or orthopnea. She missed her recent presyncope without overt syncope. She denies recent chest pressure. Her last Myoview study was in 2011. She denies recent nausea vomiting. She has had issues with diarrhea for which he sees Dr. Jarold Motto. She is unaware of blood in her stool or urine. She denies claudication. She does note leg swelling bilaterally. She does have hypothyroidism. She does have hyperlipidemia. There is no history of known diabetes. She has had some memory loss and has been on Aricept. She denies seizures. She is unaware of snoring. She denies daytime sleepiness. There is a history of brain tumor in 1954. Other comprehensive 14 point system review is negative.  PE BP 160/79  Pulse 74  Ht 4\' 10"  (1.473 m)  Wt 123 lb 9.6 oz (56.065 kg)  BMI 25.84 kg/m2 Repeat blood pressure 140/70 supine 130/70 standing. She lost 7 pounds since her last office visit with Dr. Alanda Amass earlier this year. General: Alert, oriented, no distress.    Skin: normal turgor, no rashes HEENT: Normocephalic, atraumatic. Pupils round and reactive; sclera anicteric; Fundi no hemorrhages. Discs flat. Nose without nasal septal hypertrophy Mouth/Parynx benign; Mallinpatti scale 2 Neck: No JVD, no carotid bruits Lungs: clear to ausculatation and percussion; no wheezing or rales Heart: RRR, s1 s2 normal 1-2/6 systolic murmur. Abdomen: soft, nontender; no hepatosplenomehaly, BS+; abdominal aorta nontender and not dilated by palpation. Pulses 2+ Extremities: 2+ lower some edema to the pretibial region the no clubbinbg cyanosis , Homan's sign negative  Neurologic: grossly nonfocal Psychologic: Normal mood and affect    ECG: Sinus rhythm  at 74 beats per minute. Intervals normal.  LABS:  BMET    Component Value Date/Time   NA 144 10/21/2009 1206   K 4.1 10/21/2009 1206   CL 107 10/21/2009 1206   CO2 30 10/21/2009 1206   GLUCOSE 91 10/21/2009 1206   BUN 16 10/21/2009 1206   CREATININE 0.7 10/21/2009 1206   CALCIUM 8.9 10/21/2009 1206   GFRNONAA 81.55 10/21/2009 1206   GFRAA  Value: >60        The eGFR has been calculated using the MDRD equation. This calculation has not been validated in all clinical situations. eGFR's persistently <60 mL/min signify possible Chronic Kidney Disease. 09/28/2009 0348     Hepatic Function Panel     Component Value Date/Time   PROT 6.7 10/21/2009 1206   ALBUMIN 3.9 10/21/2009 1206   AST 24 10/21/2009 1206   ALT 19 10/21/2009 1206   ALKPHOS 94 10/21/2009 1206   BILITOT 0.6 10/21/2009 1206   BILIDIR 0.1 10/21/2009 1206     CBC    Component Value Date/Time   WBC 4.5 10/19/2011 1111   RBC 3.72* 10/19/2011 1111   HGB 11.3* 10/19/2011 1111   HCT 34.1* 10/19/2011 1111   PLT 168.0 10/19/2011 1111   MCV 91.7 10/19/2011 1111   MCHC 33.0 10/19/2011 1111   RDW 13.7 10/19/2011 1111   LYMPHSABS 1.3 10/19/2011 1111   MONOABS 0.3 10/19/2011 1111   EOSABS 0.2 10/19/2011 1111   BASOSABS 0.0 10/19/2011 1111     BNP No results found for this basename: probnp     Lipid Panel  No results found for this basename: chol, trig, hdl, cholhdl, vldl, ldlcalc     RADIOLOGY: No results found.   ASSESSMENT AND PLAN: My impression is that Ms. Suzette Battiest is an 79 year old female who is now 23 years status post a myocardial infarction and subsequent CABG revascularization surgery with LIMA to LAD and SVG to  diagonal. In 2005 she underwent stenting to her mid right coronary artery and a 3.0x18 mm drug-eluting Cypher stent was inserted. At that time, her proximal LAD was occluded, the 75% stenosis and a small intermediate branch, the circumflex was on bypass the normal and the graft were patent. She does have issues with labile hypertension. She also has progressive lower some of the edema. She recently has had episodes of diarrhea. Presently her blood pressure today is without orthostatic change. I am recommending a complete set of laboratory be checked in the fasting state consisting of a CBC, CMP, magnesium level, lipid panel, and TSH level. I'm scheduling her for CardioNet monitor to wear for at least 2 weeks. I'm also scheduling her for LexiScan Myoview study to assess for regression, scar/ischemia. I'm increasing her diuretic Lasix from 40 mg per day to 40 mg in the morning and 20 mg in the afternoon. I'll see her back in the office in 6 weeks and followup above studies and further recommendations will be made at that time.   Lennette Bihari, MD, [facility] 03/18/2013 8:36 PM

## 2013-03-20 ENCOUNTER — Ambulatory Visit: Payer: Medicare Other | Admitting: Gastroenterology

## 2013-03-27 ENCOUNTER — Ambulatory Visit (INDEPENDENT_AMBULATORY_CARE_PROVIDER_SITE_OTHER): Payer: Medicare Other | Admitting: Gastroenterology

## 2013-03-27 ENCOUNTER — Encounter: Payer: Self-pay | Admitting: Gastroenterology

## 2013-03-27 ENCOUNTER — Other Ambulatory Visit (INDEPENDENT_AMBULATORY_CARE_PROVIDER_SITE_OTHER): Payer: Medicare Other

## 2013-03-27 VITALS — BP 110/60 | HR 73 | Ht 58.47 in | Wt 123.5 lb

## 2013-03-27 DIAGNOSIS — Z8719 Personal history of other diseases of the digestive system: Secondary | ICD-10-CM

## 2013-03-27 DIAGNOSIS — Z9089 Acquired absence of other organs: Secondary | ICD-10-CM

## 2013-03-27 DIAGNOSIS — Z9049 Acquired absence of other specified parts of digestive tract: Secondary | ICD-10-CM

## 2013-03-27 DIAGNOSIS — Z9889 Other specified postprocedural states: Secondary | ICD-10-CM

## 2013-03-27 DIAGNOSIS — R197 Diarrhea, unspecified: Secondary | ICD-10-CM

## 2013-03-27 DIAGNOSIS — R109 Unspecified abdominal pain: Secondary | ICD-10-CM

## 2013-03-27 LAB — HEPATIC FUNCTION PANEL
ALT: 31 U/L (ref 0–35)
AST: 36 U/L (ref 0–37)
Albumin: 3.8 g/dL (ref 3.5–5.2)
Alkaline Phosphatase: 89 U/L (ref 39–117)
Total Protein: 7 g/dL (ref 6.0–8.3)

## 2013-03-27 LAB — CBC WITH DIFFERENTIAL/PLATELET
Basophils Absolute: 0 10*3/uL (ref 0.0–0.1)
Basophils Relative: 0.8 % (ref 0.0–3.0)
Eosinophils Absolute: 0.3 10*3/uL (ref 0.0–0.7)
Lymphocytes Relative: 30.1 % (ref 12.0–46.0)
MCHC: 33.3 g/dL (ref 30.0–36.0)
MCV: 94.8 fl (ref 78.0–100.0)
Monocytes Absolute: 0.3 10*3/uL (ref 0.1–1.0)
Neutrophils Relative %: 58.4 % (ref 43.0–77.0)
Platelets: 185 10*3/uL (ref 150.0–400.0)
RBC: 3.58 Mil/uL — ABNORMAL LOW (ref 3.87–5.11)
RDW: 12.9 % (ref 11.5–14.6)

## 2013-03-27 LAB — BASIC METABOLIC PANEL
BUN: 34 mg/dL — ABNORMAL HIGH (ref 6–23)
CO2: 24 mEq/L (ref 19–32)
Chloride: 105 mEq/L (ref 96–112)
GFR: 30.43 mL/min — ABNORMAL LOW (ref >60.00)
Potassium: 4.6 mEq/L (ref 3.5–5.1)
Sodium: 137 mEq/L (ref 135–145)

## 2013-03-27 LAB — FERRITIN: Ferritin: 87.7 ng/mL (ref 10.0–291.0)

## 2013-03-27 LAB — FOLATE: Folate: >24.8 ng/mL (ref >5.9)

## 2013-03-27 LAB — TSH: TSH: 1.89 u[IU]/mL (ref 0.35–5.50)

## 2013-03-27 LAB — C-REACTIVE PROTEIN: CRP: <0.5 mg/dL (ref 0.5–20.0)

## 2013-03-27 LAB — IBC PANEL: Saturation Ratios: 22.9 % (ref 20.0–50.0)

## 2013-03-27 MED ORDER — HYOSCYAMINE SULFATE 0.125 MG SL SUBL: 0.1250 mg | SUBLINGUAL_TABLET | SUBLINGUAL | Status: AC | PRN

## 2013-03-27 MED ORDER — METRONIDAZOLE 500 MG PO TABS: 500.0000 mg | ORAL_TABLET | Freq: Two times a day (BID) | ORAL | Status: AC

## 2013-03-27 NOTE — Progress Notes (Signed)
This is a very complicated 79 year old Caucasian female with chronic dementia who now presents with worsening IBS complaints of gas, bloating, crampy lower abdominal pain.  She is status post fundoplication and cholecystectomy.  She denies diarrhea, constipation, melena or hematochezia.  Her, pain appears to be relieved by passing gas, she was nonabsorbable carbohydrates.  She's had no nausea and vomiting, fever, chills, genitourinary or gynecologic symptoms.  Her appetite is good her weight is stable.  She is accompanied by her daughter is usually with her.  She denies a specific food intolerances.  She is chronically on Plavix for cardiovascular issues.  He has chronic diarrhea syndrome and uses when necessary loperamide.  She is status post fundoplication has a history of presbyesophagus.  Endoscopy within the last several years, upper GI series, and esophageal manometry of all been unremarkable.  Current Medications, Allergies, Past Medical History, Past Surgical History, Family History and Social History were reviewed in [employer] record.  ROS: All systems were reviewed and are negative unless otherwise stated in the HPI.          Physical Exam: But healthy-appearing patient in no acute distress.  Blood pressure 110/80, pulse 73 and regular and weight 123 with a BMI of 25.4.  Her abdomen shows no distention, organomegaly, masses or tenderness.  Bowel sounds are very active in nature.  Mental status is stable but she has obvious dementia.    Assessment and Plan: chronic diarrhea predominant IBS with recent exacerbation of questionable etiology.  I suspect she probably has some element of bacterial overgrowth syndrome and have placed her on metronidazole 500 mg twice a day for one week with when necessary sublingual Levsin every 8 hours as needed and tolerated.  If her pain worsens we'll obtain CT scan of the abdomen and pelvis.  Labs ordered today for review including CBC,  anemia metabolic profiles with liver function tests.  She is status post cholecystectomy.  She has no evidence on exam of acute appendicitis.  Colonoscopy in April 2009 was unremarkable except for diverticulosis.  Biopsies for microscopic-collagenous colitis were negative.  She may have an element of subacute diverticulitis currently.  All of her instructions were given to patient and her daughter who was present throughout the interview and exam.  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  For now we'll review her labs and see how she does symptomatically before proceeding further.

## 2013-03-27 NOTE — Patient Instructions (Addendum)
Please follow up in one year or as needed   We have sent the following medications to your pharmacy for you to pick up at your convenience: Flagyl 500 mg, please take one tablet by mouth twice daily for a week  Levsin, please take as needed   Your physician has requested that you go to the basement for the following lab work before leaving today: Anemia Profile  CRP  Sedimentation Rate CBC TSH Liver Function  BMP

## 2013-04-02 ENCOUNTER — Telehealth: Payer: Self-pay | Admitting: Cardiovascular Disease

## 2013-04-02 NOTE — Telephone Encounter (Signed)
Per answering service from yesterday. Pt BP was 98/46-please call.

## 2013-04-02 NOTE — Telephone Encounter (Signed)
Returned call and pt verified x 2 w/ pt's daughter, Joyce Gross.  Daughter stated pt is better now.  Stated BP was low on Saturday and pt was staggering.  Stated recheck it was 117/71 (??) and HR 65.  RN advised to make sure pt is hydrated b/c she is on Lasix and changes positions slowly.  Advised she lie down and elevate LEs if low BP w/ symptoms occurs again.  Verbalized understanding and agreed w/ plan.

## 2013-04-04 ENCOUNTER — Telehealth (HOSPITAL_COMMUNITY): Payer: Self-pay | Admitting: *Deleted

## 2013-04-16 ENCOUNTER — Other Ambulatory Visit: Payer: Self-pay | Admitting: *Deleted

## 2013-04-16 MED ORDER — POTASSIUM CHLORIDE ER 10 MEQ PO TBCR: 10.0000 meq | EXTENDED_RELEASE_TABLET | Freq: Every day | ORAL | Status: AC

## 2013-04-16 NOTE — Telephone Encounter (Signed)
Rx was sent to pharmacy electronically. 

## 2013-04-18 ENCOUNTER — Telehealth (HOSPITAL_COMMUNITY): Payer: Self-pay | Admitting: *Deleted

## 2013-05-30 IMAGING — RF DG ESOPHAGUS
14 of 24 series · 14 of 24 positions shown · non-contrast
Comparison: None.

CLINICAL DATA: Dysphasia.

ESOPHOGRAM / BARIUM SWALLOW / BARIUM TABLET STUDY
TECHNIQUE: Combined double contrast and single contrast
examination performed using effervescent crystals, thick barium
liquid, and thin barium liquid.  The patient was observed with
fluoroscopy swallowing a 13mm barium sulphate tablet.
Fluoroscopy time:  5.1 minutes.

[Series 1: run · 1 of 1 slices shown (1 of 14)]
[im 1/1]
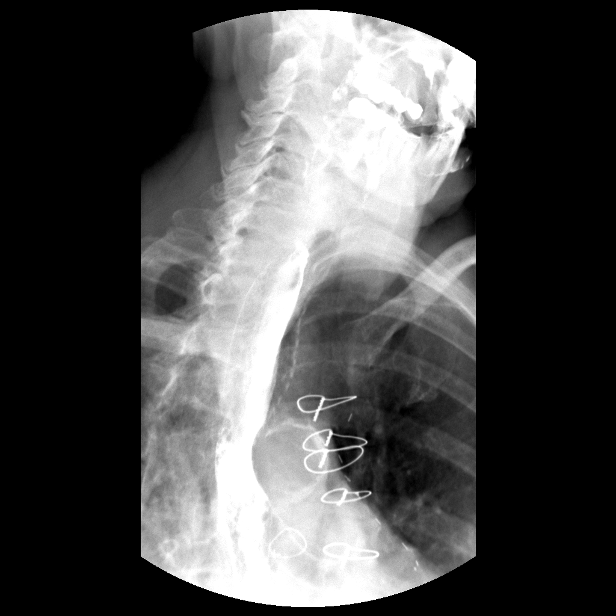

[Series 2: run · 1 of 1 slices shown (2 of 14)]
[im 1/1]
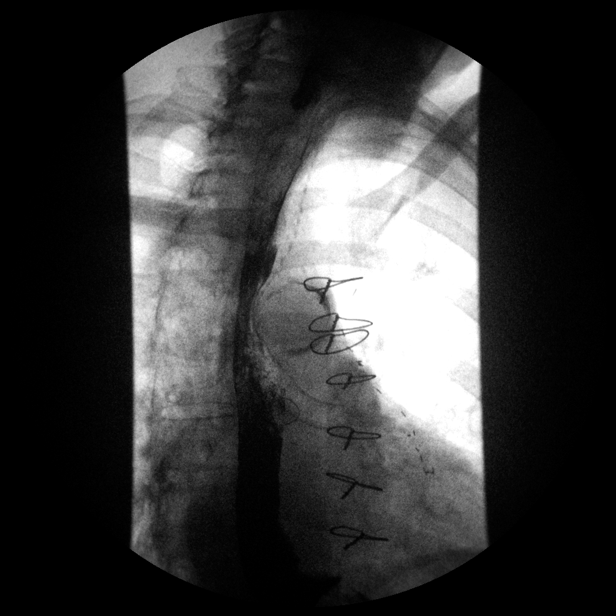

[Series 3: run · 1 of 1 slices shown (3 of 14)]
[im 1/1]
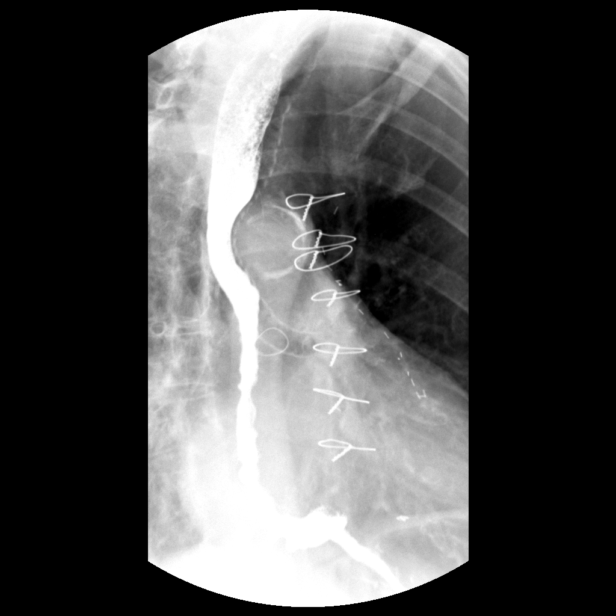

[Series 4: run · 1 of 1 slices shown (4 of 14)]
[im 1/1]
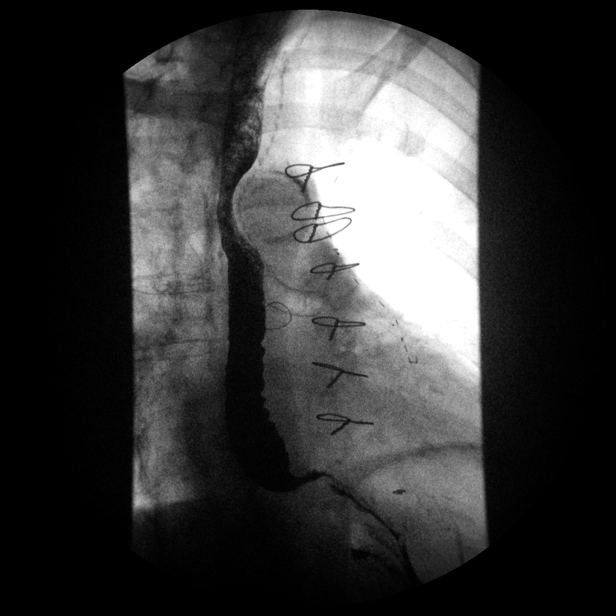

[Series 4: run · 1 of 1 slices shown (5 of 14)]
[im 1/1]
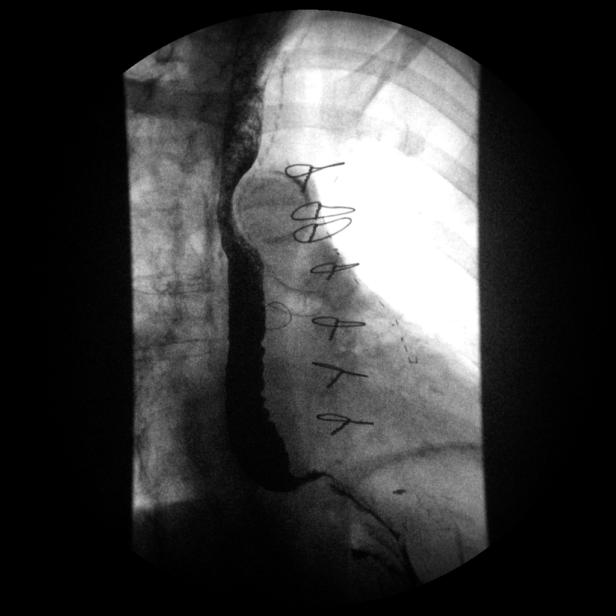

[Series 5: run · 1 of 1 slices shown (6 of 14)]
[im 1/1]
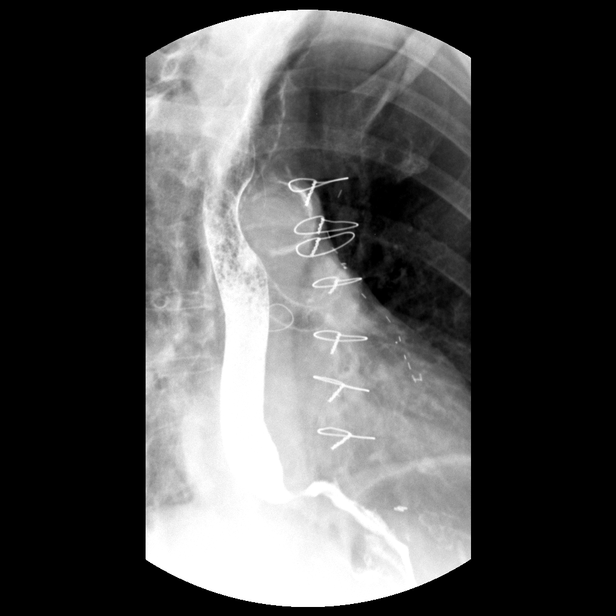

[Series 6: run · 1 of 1 slices shown (7 of 14)]
[im 1/1]
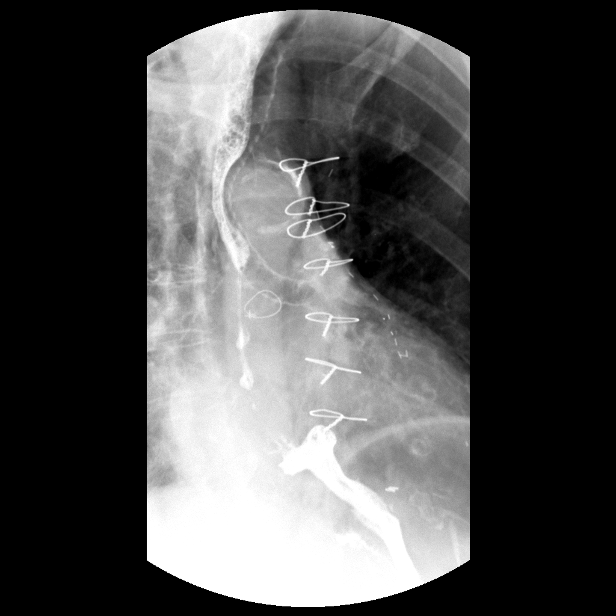

[Series 7: run · 1 of 1 slices shown (8 of 14)]
[im 1/1]
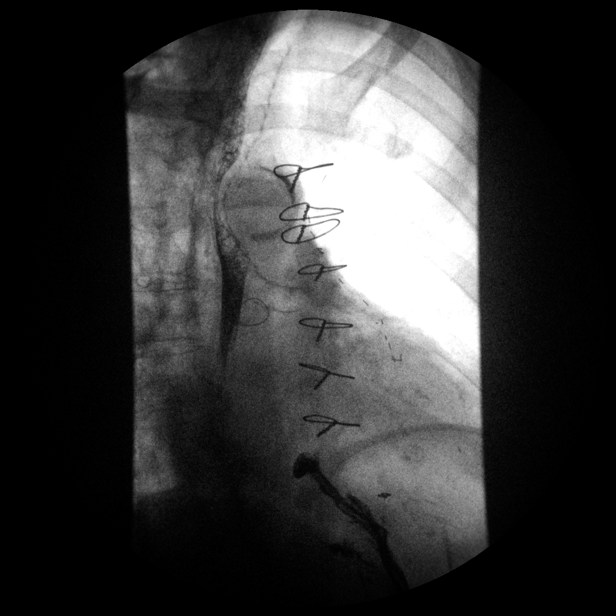

[Series 8: run · 1 of 1 slices shown (9 of 14)]
[im 1/1]
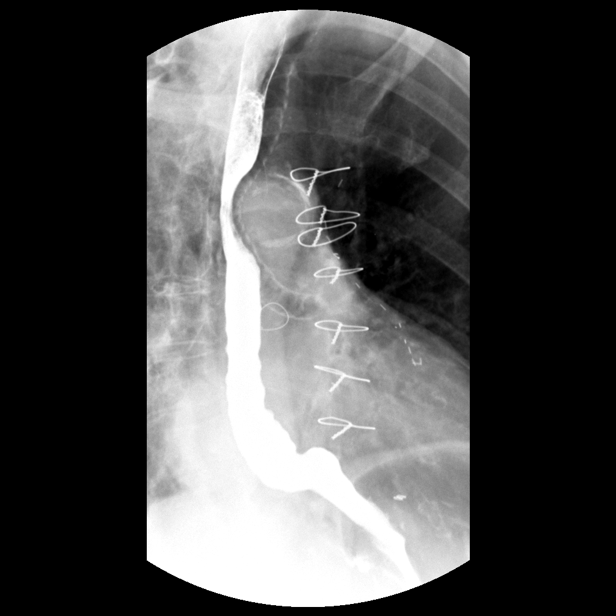

[Series 9: run · 1 of 1 slices shown (10 of 14)]
[im 1/1]
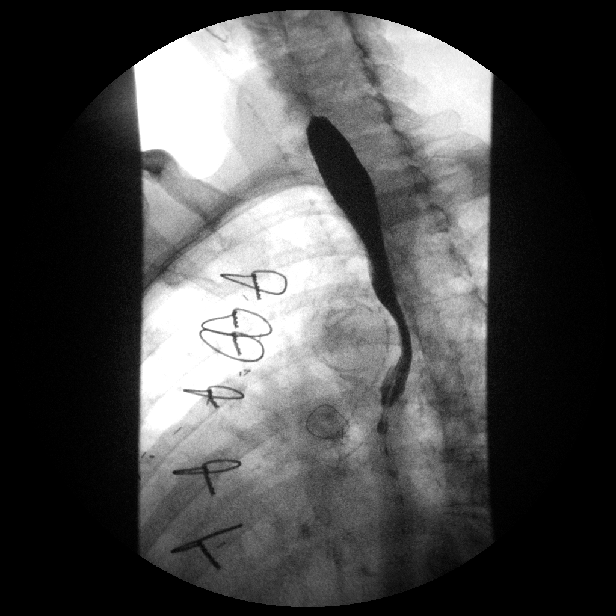

[Series 10: run · 1 of 1 slices shown (11 of 14)]
[im 1/1]
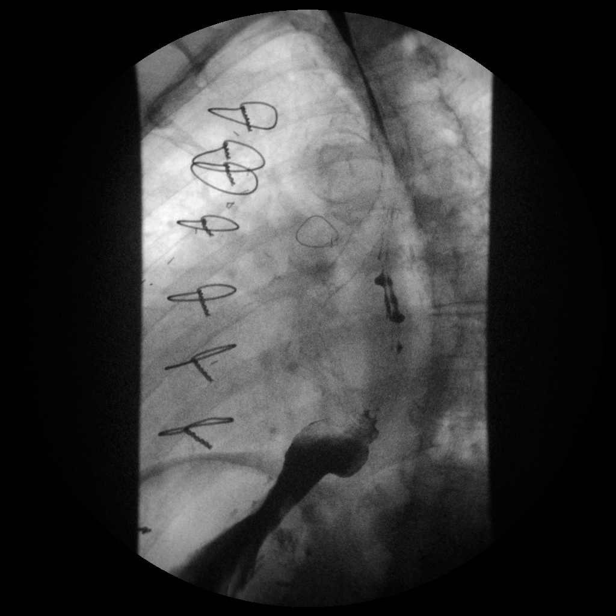

[Series 10: run · 1 of 1 slices shown (12 of 14)]
[im 1/1]
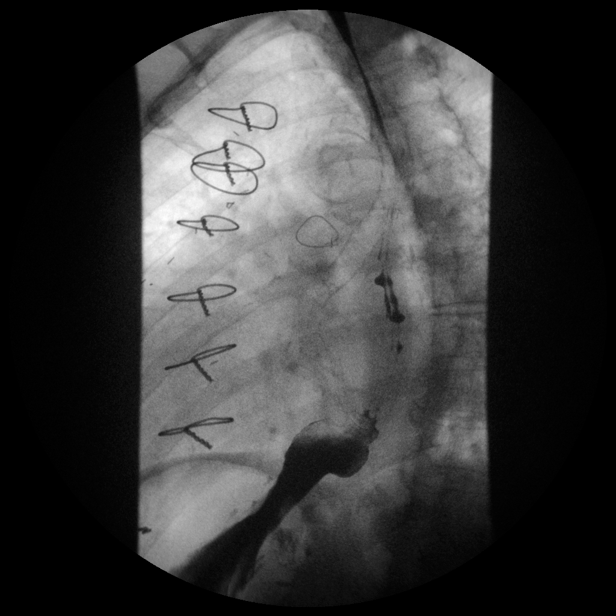

[Series 11: run · 1 of 1 slices shown (13 of 14)]
[im 1/1]
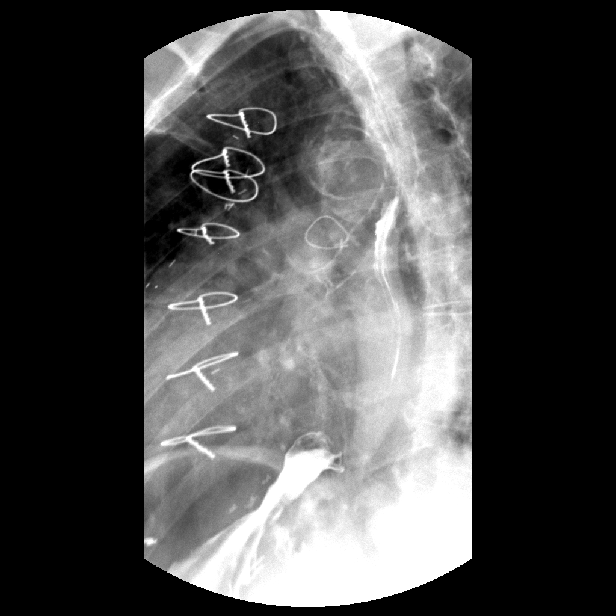

[Series 12: run · 1 of 1 slices shown (14 of 14)]
[im 1/1]
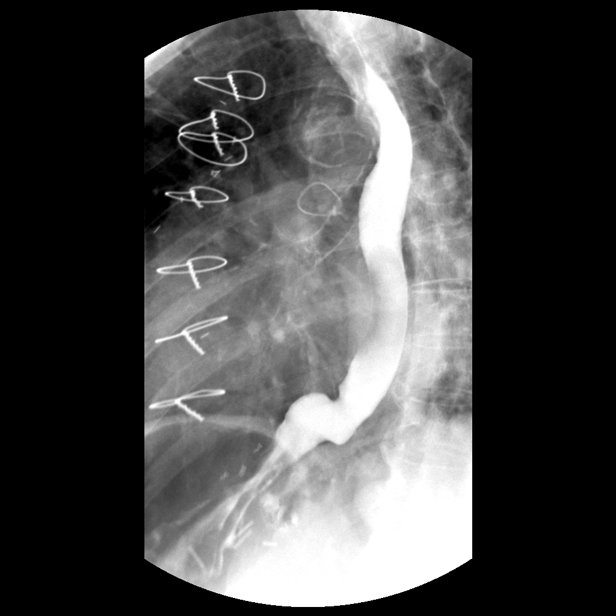

[14 of 24 positions shown; findings below may reference images not displayed]

FINDINGS: Initial double contrast images demonstrate a normal
appearance of the esophageal mucosa.  Multiple single swallow
attempts demonstrated incomplete clearance of contrast from the
esophagus by the primary peristaltic wave, with disordered tertiary
waves and retention of contrast within the esophagus.  A small
hiatal hernia is present (sliding-type).  No obstructing mass, ring
or stricture is noted.  A barium tablet was administered, and
readily passed into the stomach.
IMPRESSION: 1.  Moderate esophageal dysmotility.
2.  Small sliding-type hiatal hernia.

## 2013-06-27 ENCOUNTER — Other Ambulatory Visit: Payer: Self-pay

## 2013-06-27 MED ORDER — FUROSEMIDE 40 MG PO TABS: ORAL_TABLET | ORAL | Status: AC

## 2013-06-27 NOTE — Telephone Encounter (Signed)
Rx was sent to pharmacy electronically. 

## 2013-07-14 ENCOUNTER — Other Ambulatory Visit: Payer: Self-pay | Admitting: Cardiovascular Disease

## 2013-08-09 NOTE — Telephone Encounter (Signed)
Closed encounter °

## 2013-08-13 ENCOUNTER — Other Ambulatory Visit: Payer: Self-pay | Admitting: *Deleted

## 2013-08-13 MED ORDER — CLOPIDOGREL BISULFATE 75 MG PO TABS: 75.0000 mg | ORAL_TABLET | Freq: Every day | ORAL | Status: AC

## 2013-08-13 MED ORDER — ATORVASTATIN CALCIUM 20 MG PO TABS: 20.0000 mg | ORAL_TABLET | Freq: Every day | ORAL | Status: AC

## 2013-08-13 NOTE — Telephone Encounter (Signed)
Refilled patient's atorvastatin and clopidogrel to [facility].

## 2014-06-06 ENCOUNTER — Encounter: Payer: Self-pay | Admitting: *Deleted

## 2014-06-07 ENCOUNTER — Ambulatory Visit: Payer: Medicare Other | Admitting: Internal Medicine

## 2014-06-18 DEATH — deceased

## 2015-02-13 ENCOUNTER — Encounter: Payer: Self-pay | Admitting: Gastroenterology
